# Patient Record
Sex: Male | Born: 1980 | Race: Black or African American | Hispanic: No | Marital: Married | Smoking: Never smoker
Health system: Southern US, Community
[De-identification: ages and names within clinical notes are randomized; demographics above are authoritative.]

## PROBLEM LIST (undated history)

## (undated) DIAGNOSIS — K219 Gastro-esophageal reflux disease without esophagitis: Secondary | ICD-10-CM

## (undated) DIAGNOSIS — E7800 Pure hypercholesterolemia, unspecified: Secondary | ICD-10-CM

## (undated) HISTORY — PX: OTHER SURGICAL HISTORY: SHX169

---

## 2003-07-24 ENCOUNTER — Encounter: Admission: RE | Admit: 2003-07-24 | Discharge: 2003-07-24 | Payer: Self-pay | Admitting: Cardiology

## 2005-02-05 ENCOUNTER — Emergency Department (HOSPITAL_COMMUNITY): Admission: EM | Admit: 2005-02-05 | Discharge: 2005-02-05 | Payer: Self-pay | Admitting: Family Medicine

## 2006-08-24 ENCOUNTER — Emergency Department (HOSPITAL_COMMUNITY): Admission: EM | Admit: 2006-08-24 | Discharge: 2006-08-24 | Payer: Self-pay | Admitting: Emergency Medicine

## 2007-04-05 ENCOUNTER — Emergency Department (HOSPITAL_COMMUNITY): Admission: EM | Admit: 2007-04-05 | Discharge: 2007-04-05 | Payer: Self-pay | Admitting: Emergency Medicine

## 2007-04-10 ENCOUNTER — Encounter: Admission: RE | Admit: 2007-04-10 | Discharge: 2007-04-10 | Payer: Self-pay | Admitting: Sports Medicine

## 2008-11-07 ENCOUNTER — Ambulatory Visit: Payer: Self-pay | Admitting: Interventional Radiology

## 2008-11-07 ENCOUNTER — Emergency Department (HOSPITAL_BASED_OUTPATIENT_CLINIC_OR_DEPARTMENT_OTHER): Admission: EM | Admit: 2008-11-07 | Discharge: 2008-11-07 | Payer: Self-pay | Admitting: Emergency Medicine

## 2010-03-13 ENCOUNTER — Emergency Department (HOSPITAL_BASED_OUTPATIENT_CLINIC_OR_DEPARTMENT_OTHER): Admission: EM | Admit: 2010-03-13 | Discharge: 2010-03-13 | Payer: Self-pay | Admitting: Emergency Medicine

## 2010-04-04 ENCOUNTER — Encounter: Admission: RE | Admit: 2010-04-04 | Discharge: 2010-04-04 | Payer: Self-pay | Admitting: Orthopedic Surgery

## 2010-09-12 LAB — BASIC METABOLIC PANEL
BUN: 16 mg/dL (ref 6–23)
CO2: 24 mEq/L (ref 19–32)
Calcium: 9.4 mg/dL (ref 8.4–10.5)
Chloride: 101 mEq/L (ref 96–112)
Creatinine, Ser: 0.8 mg/dL (ref 0.4–1.5)
GFR calc Af Amer: >60 mL/min (ref >60)
GFR calc non Af Amer: >60 mL/min (ref >60)
Glucose, Bld: 255 mg/dL — ABNORMAL HIGH (ref 70–99)
Potassium: 4 mEq/L (ref 3.5–5.1)
Sodium: 137 mEq/L (ref 135–145)

## 2010-09-12 LAB — CBC
HCT: 43.9 % (ref 39.0–52.0)
Hemoglobin: 15 g/dL (ref 13.0–17.0)
MCH: 29.7 pg (ref 26.0–34.0)
MCHC: 34.1 g/dL (ref 30.0–36.0)
MCV: 87.1 fL (ref 78.0–100.0)
Platelets: 231 10*3/uL (ref 150–400)
RBC: 5.04 MIL/uL (ref 4.22–5.81)
RDW: 12.2 % (ref 11.5–15.5)
WBC: 7.2 10*3/uL (ref 4.0–10.5)

## 2010-10-08 LAB — URINALYSIS, ROUTINE W REFLEX MICROSCOPIC
Bilirubin Urine: NEGATIVE
Glucose, UA: >1000 mg/dL — AB
Hgb urine dipstick: NEGATIVE
Ketones, ur: NEGATIVE mg/dL
Leukocytes, UA: NEGATIVE
Nitrite: NEGATIVE
Protein, ur: NEGATIVE mg/dL
Specific Gravity, Urine: 1.026 (ref 1.005–1.030)
Urobilinogen, UA: 0.2 mg/dL (ref 0.0–1.0)
pH: 6 (ref 5.0–8.0)

## 2010-10-08 LAB — COMPREHENSIVE METABOLIC PANEL
ALT: 22 U/L (ref 0–53)
AST: 23 U/L (ref 0–37)
Albumin: 4.2 g/dL (ref 3.5–5.2)
Alkaline Phosphatase: 53 U/L (ref 39–117)
BUN: 11 mg/dL (ref 6–23)
CO2: 28 mEq/L (ref 19–32)
Calcium: 8.9 mg/dL (ref 8.4–10.5)
Chloride: 100 mEq/L (ref 96–112)
Creatinine, Ser: 0.8 mg/dL (ref 0.4–1.5)
GFR calc Af Amer: >60 mL/min (ref >60)
GFR calc non Af Amer: >60 mL/min (ref >60)
Glucose, Bld: 268 mg/dL — ABNORMAL HIGH (ref 70–99)
Potassium: 4.1 mEq/L (ref 3.5–5.1)
Sodium: 139 mEq/L (ref 135–145)
Total Bilirubin: 0.7 mg/dL (ref 0.3–1.2)
Total Protein: 8 g/dL (ref 6.0–8.3)

## 2010-10-08 LAB — CBC
HCT: 46.4 % (ref 39.0–52.0)
Hemoglobin: 15 g/dL (ref 13.0–17.0)
MCHC: 32.4 g/dL (ref 30.0–36.0)
MCV: 88.4 fL (ref 78.0–100.0)
Platelets: 251 10*3/uL (ref 150–400)
RBC: 5.25 MIL/uL (ref 4.22–5.81)
RDW: 12.5 % (ref 11.5–15.5)
WBC: 5.2 10*3/uL (ref 4.0–10.5)

## 2010-10-08 LAB — DIFFERENTIAL
Basophils Absolute: 0 10*3/uL (ref 0.0–0.1)
Basophils Relative: 1 % (ref 0–1)
Eosinophils Absolute: 0.1 10*3/uL (ref 0.0–0.7)
Eosinophils Relative: 3 % (ref 0–5)
Lymphocytes Relative: 38 % (ref 12–46)
Lymphs Abs: 2 10*3/uL (ref 0.7–4.0)
Monocytes Absolute: 0.4 10*3/uL (ref 0.1–1.0)
Monocytes Relative: 7 % (ref 3–12)
Neutro Abs: 2.7 10*3/uL (ref 1.7–7.7)
Neutrophils Relative %: 52 % (ref 43–77)

## 2010-10-08 LAB — LIPASE, BLOOD: Lipase: 76 U/L (ref 23–300)

## 2010-10-08 LAB — URINE MICROSCOPIC-ADD ON

## 2011-02-11 ENCOUNTER — Emergency Department (HOSPITAL_BASED_OUTPATIENT_CLINIC_OR_DEPARTMENT_OTHER)
Admission: EM | Admit: 2011-02-11 | Discharge: 2011-02-11 | Disposition: A | Discharge status: Home / Self Care | Payer: BC Managed Care – PPO | Attending: Emergency Medicine | Admitting: Emergency Medicine

## 2011-02-11 ENCOUNTER — Encounter: Payer: Self-pay | Admitting: *Deleted

## 2011-02-11 ENCOUNTER — Emergency Department (INDEPENDENT_AMBULATORY_CARE_PROVIDER_SITE_OTHER): Payer: BC Managed Care – PPO

## 2011-02-11 DIAGNOSIS — E119 Type 2 diabetes mellitus without complications: Secondary | ICD-10-CM | POA: Insufficient documentation

## 2011-02-11 DIAGNOSIS — E7800 Pure hypercholesterolemia, unspecified: Secondary | ICD-10-CM | POA: Insufficient documentation

## 2011-02-11 DIAGNOSIS — S99929A Unspecified injury of unspecified foot, initial encounter: Secondary | ICD-10-CM

## 2011-02-11 DIAGNOSIS — K219 Gastro-esophageal reflux disease without esophagitis: Secondary | ICD-10-CM | POA: Insufficient documentation

## 2011-02-11 DIAGNOSIS — S91109A Unspecified open wound of unspecified toe(s) without damage to nail, initial encounter: Secondary | ICD-10-CM | POA: Insufficient documentation

## 2011-02-11 DIAGNOSIS — W2209XA Striking against other stationary object, initial encounter: Secondary | ICD-10-CM | POA: Insufficient documentation

## 2011-02-11 DIAGNOSIS — S8990XA Unspecified injury of unspecified lower leg, initial encounter: Secondary | ICD-10-CM | POA: Insufficient documentation

## 2011-02-11 DIAGNOSIS — IMO0002 Reserved for concepts with insufficient information to code with codable children: Secondary | ICD-10-CM

## 2011-02-11 DIAGNOSIS — I10 Essential (primary) hypertension: Secondary | ICD-10-CM | POA: Insufficient documentation

## 2011-02-11 DIAGNOSIS — M7989 Other specified soft tissue disorders: Secondary | ICD-10-CM

## 2011-02-11 DIAGNOSIS — M79609 Pain in unspecified limb: Secondary | ICD-10-CM

## 2011-02-11 HISTORY — DX: Pure hypercholesterolemia, unspecified: E78.00

## 2011-02-11 HISTORY — DX: Gastro-esophageal reflux disease without esophagitis: K21.9

## 2011-02-11 MED ORDER — CEPHALEXIN 500 MG PO CAPS: 500.0000 mg | ORAL_CAPSULE | Freq: Four times a day (QID) | ORAL | Status: AC

## 2011-02-11 MED ORDER — HYDROCODONE-ACETAMINOPHEN 5-325 MG PO TABS
1.0000 | ORAL_TABLET | Freq: Once | ORAL | Status: AC
  Administered 2011-02-11: 1 via ORAL
  Filled 2011-02-11: qty 1

## 2011-02-11 MED ORDER — HYDROCODONE-ACETAMINOPHEN 5-500 MG PO TABS: 1.0000 | ORAL_TABLET | Freq: Four times a day (QID) | ORAL | Status: AC | PRN

## 2011-02-11 MED ORDER — LIDOCAINE HCL (PF) 1 % IJ SOLN
5.0000 mL | Freq: Once | INTRAMUSCULAR | Status: AC
  Administered 2011-02-11: 5 mL via INTRADERMAL
  Filled 2011-02-11: qty 5

## 2011-02-11 NOTE — ED Notes (Signed)
Pt tripped on a toy tonight approx 2 hours ago, and caught himself before he fell. Pt c/o right 3rd/4th toe pain. Injury to nail bed of 3rd toe and swelling to 4th toe.

## 2011-02-11 NOTE — ED Provider Notes (Signed)
History     CSN: [id number] Arrival date & time: 02/11/2011  1:05 AM  Chief Complaint  Patient presents with  . Toe Pain   HPI Comments: 30yoM NIDDM pw toe pain just pta. PT states that he stepped on his daughter's toy and "stubbed my toe on it or something". C/O pain to 3rd and 4th toes and bleeding to 3rd toe. Denies other injury. No head trauma/LOC. Unable to bear weight RLE 2/2 pain. Denies numbness/tingling. States he cannot move his 4th toe 2/2 pain. Last tetanus 2 years ago  Patient is a 30 y.o. male presenting with toe pain.  Toe Pain    Past Medical History  Diagnosis Date  . Diabetes mellitus   . Hypertension   . High cholesterol   . GERD (gastroesophageal reflux disease)     Past Surgical History  Procedure Date  . Arm surgery     No family history on file.  History  Substance Use Topics  . Smoking status: Never Smoker   . Smokeless tobacco: Not on file  . Alcohol Use: Yes     occasionally      Review of Systems  All other systems reviewed and are negative.  except as noted HPI   Physical Exam  BP 155/90  Pulse 74  Temp(Src) 98.2 F (36.8 C) (Oral)  Resp 18  Ht 6\' 1"  (1.854 m)  Wt 305 lb (138.347 kg)  BMI 40.24 kg/m2  SpO2 96%  Physical Exam  Nursing note and vitals reviewed. Constitutional: He is oriented to person, place, and time. He appears well-developed and well-nourished. No distress.       Appears to be in pain  HENT:  Head: Atraumatic.  Mouth/Throat: Oropharynx is clear and moist.  Eyes: Conjunctivae are normal. Pupils are equal, round, and reactive to light.  Neck: Neck supple.  Cardiovascular: Normal rate, regular rhythm, normal heart sounds and intact distal pulses.  Exam reveals no gallop and no friction rub.   No murmur heard. Pulmonary/Chest: Effort normal. No respiratory distress. He has no wheezes. He has no rales.  Abdominal: Soft. Bowel sounds are normal. There is no tenderness. There is no rebound and no guarding.    Musculoskeletal: Normal range of motion. He exhibits no edema and no tenderness.       Rt foot without swelling/erythema/ttp. 4th toe with diffuse swelling and ttp, limited ROM 2/2 pain. Gross sensation intact. 3rd toe with min swelling distally + blood and partial avulsion of nail. +ttp, gross sensation intact  DP and PT intact  Neurological: He is alert and oriented to person, place, and time.  Skin: Skin is warm and dry.  Psychiatric: He has a normal mood and affect.    ED Course  NERVE BLOCK Date/Time: 02/11/2011 4:28 AM Performed by: Forbes Cellar Authorized by: Forbes Cellar Consent: Verbal consent obtained. Risks and benefits: risks, benefits and alternatives were discussed Consent given by: patient Patient understanding: patient states understanding of the procedure being performed Patient consent: the patient's understanding of the procedure matches consent given Procedure consent: procedure consent matches procedure scheduled Imaging studies: imaging studies available Patient identity confirmed: verbally with patient Time out: Immediately prior to procedure a "time out" was called to verify the correct patient, procedure, equipment, support staff and site/side marked as required. Indications: pain relief Body area: lower extremity Nerve: digital Laterality: right Patient sedated: no Patient position: sitting Needle gauge: 25 G Location technique: anatomical landmarks Local anesthetic: lidocaine 1% without epinephrine Anesthetic total: 5 ml  Outcome: pain improved  NAIL REMOVAL Date/Time: 02/11/2011 4:29 AM Performed by: Forbes Cellar Authorized by: Forbes Cellar Consent: Verbal consent obtained. Risks and benefits: risks, benefits and alternatives were discussed Consent given by: patient Patient understanding: patient states understanding of the procedure being performed Patient consent: the patient's understanding of the procedure matches consent  given Procedure consent: procedure consent matches procedure scheduled Patient identity confirmed: verbally with patient Location: right foot Location details: right third toe Anesthesia: digital block Local anesthetic: lidocaine 1% without epinephrine Anesthetic total: 5 ml Patient sedated: no Preparation: skin prepped with alcohol and skin prepped with Betadine Amount removed: partial Wedge excision of skin of nail fold: no Nail matrix removed: none Removed nail replaced and anchored: yes Dressing: dressing applied Patient tolerance: Patient tolerated the procedure well with no immediate complications. Comments: Nail partially removed by mechanism of injury, remains attached at eponychium. Abrasion to nailbed noted 3mm. Dermabond applied and nail replaced.    MDM 30yo diabetic male with mechanical toe injury. Will take of nail to evaluate for laceration of nailbed.  Has close PMD f/u   4:32 AM Abrasion to nailbed as above. Home with pain medication, prophylactic abx given pt's h/o diabetes. Given precautions for return. Needs wound check in 2 days.  Eden Lathe, MD 02/11/11 [phone]

## 2011-02-11 NOTE — ED Notes (Signed)
MD at bedside. 

## 2011-04-05 ENCOUNTER — Emergency Department (HOSPITAL_BASED_OUTPATIENT_CLINIC_OR_DEPARTMENT_OTHER)
Admission: EM | Admit: 2011-04-05 | Discharge: 2011-04-05 | Disposition: A | Discharge status: Home / Self Care | Payer: BC Managed Care – PPO | Attending: Emergency Medicine | Admitting: Emergency Medicine

## 2011-04-05 ENCOUNTER — Encounter (HOSPITAL_BASED_OUTPATIENT_CLINIC_OR_DEPARTMENT_OTHER): Payer: Self-pay | Admitting: Emergency Medicine

## 2011-04-05 ENCOUNTER — Emergency Department (INDEPENDENT_AMBULATORY_CARE_PROVIDER_SITE_OTHER): Payer: BC Managed Care – PPO

## 2011-04-05 DIAGNOSIS — R112 Nausea with vomiting, unspecified: Secondary | ICD-10-CM

## 2011-04-05 DIAGNOSIS — N390 Urinary tract infection, site not specified: Secondary | ICD-10-CM

## 2011-04-05 DIAGNOSIS — Z79899 Other long term (current) drug therapy: Secondary | ICD-10-CM | POA: Insufficient documentation

## 2011-04-05 DIAGNOSIS — E1169 Type 2 diabetes mellitus with other specified complication: Secondary | ICD-10-CM | POA: Insufficient documentation

## 2011-04-05 DIAGNOSIS — R109 Unspecified abdominal pain: Secondary | ICD-10-CM

## 2011-04-05 DIAGNOSIS — E7800 Pure hypercholesterolemia, unspecified: Secondary | ICD-10-CM | POA: Insufficient documentation

## 2011-04-05 DIAGNOSIS — E119 Type 2 diabetes mellitus without complications: Secondary | ICD-10-CM

## 2011-04-05 DIAGNOSIS — I10 Essential (primary) hypertension: Secondary | ICD-10-CM

## 2011-04-05 DIAGNOSIS — K219 Gastro-esophageal reflux disease without esophagitis: Secondary | ICD-10-CM | POA: Insufficient documentation

## 2011-04-05 DIAGNOSIS — R739 Hyperglycemia, unspecified: Secondary | ICD-10-CM

## 2011-04-05 LAB — URINALYSIS, ROUTINE W REFLEX MICROSCOPIC
Glucose, UA: >1000 mg/dL — AB
Leukocytes, UA: NEGATIVE
Specific Gravity, Urine: 1.036 — ABNORMAL HIGH (ref 1.005–1.030)
pH: 5.5 (ref 5.0–8.0)

## 2011-04-05 LAB — COMPREHENSIVE METABOLIC PANEL
ALT: 31 U/L (ref 0–53)
Albumin: 4.2 g/dL (ref 3.5–5.2)
Alkaline Phosphatase: 49 U/L (ref 39–117)
BUN: 9 mg/dL (ref 6–23)
Chloride: 95 mEq/L — ABNORMAL LOW (ref 96–112)
Glucose, Bld: 337 mg/dL — ABNORMAL HIGH (ref 70–99)
Potassium: 4.3 mEq/L (ref 3.5–5.1)
Total Bilirubin: 0.6 mg/dL (ref 0.3–1.2)

## 2011-04-05 LAB — CBC
HCT: 44.6 % (ref 39.0–52.0)
Hemoglobin: 15.8 g/dL (ref 13.0–17.0)
RDW: 12.6 % (ref 11.5–15.5)
WBC: 14.7 10*3/uL — ABNORMAL HIGH (ref 4.0–10.5)

## 2011-04-05 LAB — URINE MICROSCOPIC-ADD ON

## 2011-04-05 MED ORDER — CIPROFLOXACIN HCL 500 MG PO TABS
500.0000 mg | ORAL_TABLET | Freq: Once | ORAL | Status: AC
  Administered 2011-04-05: 500 mg via ORAL
  Filled 2011-04-05: qty 1

## 2011-04-05 MED ORDER — ONDANSETRON HCL 4 MG/2ML IJ SOLN
4.0000 mg | Freq: Once | INTRAMUSCULAR | Status: AC
  Administered 2011-04-05: 4 mg via INTRAVENOUS
  Filled 2011-04-05: qty 2

## 2011-04-05 MED ORDER — HYDROCODONE-ACETAMINOPHEN 5-325 MG PO TABS: 1.0000 | ORAL_TABLET | ORAL | Status: AC | PRN

## 2011-04-05 MED ORDER — HYDROMORPHONE HCL 1 MG/ML IJ SOLN
1.0000 mg | Freq: Once | INTRAMUSCULAR | Status: AC
  Administered 2011-04-05: 1 mg via INTRAVENOUS
  Filled 2011-04-05: qty 1

## 2011-04-05 MED ORDER — ONDANSETRON HCL 4 MG PO TABS: 8.0000 mg | ORAL_TABLET | Freq: Three times a day (TID) | ORAL | Status: AC | PRN

## 2011-04-05 MED ORDER — CIPROFLOXACIN HCL 500 MG PO TABS: 500.0000 mg | ORAL_TABLET | Freq: Two times a day (BID) | ORAL | Status: AC

## 2011-04-05 MED ORDER — MORPHINE SULFATE 4 MG/ML IJ SOLN
6.0000 mg | Freq: Once | INTRAMUSCULAR | Status: AC
  Administered 2011-04-05: 6 mg via INTRAVENOUS
  Filled 2011-04-05: qty 2

## 2011-04-05 MED ORDER — IOHEXOL 300 MG/ML  SOLN
100.0000 mL | Freq: Once | INTRAMUSCULAR | Status: AC | PRN
  Administered 2011-04-05: 100 mL via INTRAVENOUS

## 2011-04-05 MED ORDER — SODIUM CHLORIDE 0.9 % IV BOLUS (SEPSIS)
1000.0000 mL | Freq: Once | INTRAVENOUS | Status: AC
  Administered 2011-04-05: 1000 mL via INTRAVENOUS

## 2011-04-05 MED ORDER — SODIUM CHLORIDE 0.9 % IV SOLN: Freq: Once | INTRAVENOUS | Status: DC

## 2011-04-05 NOTE — ED Notes (Signed)
MD at bedside.EDP Campos at bed side. 

## 2011-04-05 NOTE — ED Provider Notes (Signed)
History     CSN: [id number] Arrival date & time: 04/05/2011 12:34 PM  Chief Complaint  Patient presents with  . Abdominal Pain     Patient is a 30 y.o. male presenting with abdominal pain. The history is provided by the patient.  Abdominal Pain The primary symptoms of the illness include abdominal pain.   patient developed umbilical and lower abdominal pain last night.  He has had nausea and vomiting has had no diarrhea.  He denies fevers or chills.  His pain is constant in nature it is moderate in severity.  It is worsened by movement and palpation.  Is not relieved by anything.  The pain is described as a soreness and fullness.  Patient denies dysuria testicular pain or penile discharge.  He denies flank pain.  Exam of similar pain in the past.  He has not tried any medicines at home.  He does have a history of diabetes.  He reports compliance with his medications  Past Medical History  Diagnosis Date  . Diabetes mellitus   . Hypertension   . High cholesterol   . GERD (gastroesophageal reflux disease)     Past Surgical History  Procedure Date  . Arm surgery     History reviewed. No pertinent family history.  History  Substance Use Topics  . Smoking status: Never Smoker   . Smokeless tobacco: Not on file  . Alcohol Use: Yes     occasionally      Review of Systems  Gastrointestinal: Positive for abdominal pain.  All other systems reviewed and are negative.    Allergies  Review of patient's allergies indicates no known allergies.  Home Medications   Current Outpatient Rx  Name Route Sig Dispense Refill  . GLIPIZIDE 10 MG PO TABS Oral Take 10 mg by mouth 2 (two) times daily.     Marland Kitchen LISINOPRIL 10 MG PO TABS Oral Take 10 mg by mouth daily.      Marland Kitchen PRAVASTATIN SODIUM 10 MG PO TABS Oral Take 10 mg by mouth daily.      Marland Kitchen SITAGLIPTIN-METFORMIN HCL 50-1000 MG PO TABS Oral Take 1 tablet by mouth 2 (two) times daily with a meal.      . ESOMEPRAZOLE MAGNESIUM 20 MG PO CPDR  Oral Take 20 mg by mouth daily before breakfast.        BP 147/76  Pulse 98  Temp 98.3 F (36.8 C)  Resp 20  Ht 6\' 1"  (1.854 m)  Wt 310 lb (140.615 kg)  BMI 40.90 kg/m2  SpO2 95%  Physical Exam  Nursing note and vitals reviewed. Constitutional: He is oriented to person, place, and time. He appears well-developed and well-nourished.  HENT:  Head: Normocephalic and atraumatic.  Eyes: EOM are normal.  Neck: Normal range of motion.  Cardiovascular: Normal rate, regular rhythm, normal heart sounds and intact distal pulses.   Pulmonary/Chest: Effort normal and breath sounds normal. No respiratory distress.  Abdominal: Soft. He exhibits no distension.       Mild lower abdominal tenderness without guarding or rebound.  Musculoskeletal: Normal range of motion.  Neurological: He is alert and oriented to person, place, and time.  Skin: Skin is warm and dry.  Psychiatric: He has a normal mood and affect. Judgment normal.    ED Course  Procedures (including critical care time)  Labs Reviewed  URINALYSIS, ROUTINE W REFLEX MICROSCOPIC - Abnormal; Notable for the following:    Specific Gravity, Urine 1.036 (*)    Glucose, UA >  1000 (*)    Ketones, ur 15 (*)    All other components within normal limits  CBC - Abnormal; Notable for the following:    WBC 14.7 (*)    All other components within normal limits  COMPREHENSIVE METABOLIC PANEL - Abnormal; Notable for the following:    Sodium 134 (*)    Chloride 95 (*)    Glucose, Bld 337 (*)    All other components within normal limits  URINE MICROSCOPIC-ADD ON - Abnormal; Notable for the following:    Bacteria, UA FEW (*)    All other components within normal limits  URINE CULTURE   Ct Abdomen Pelvis W Contrast  04/05/2011  *RADIOLOGY REPORT*  Clinical Data: Lower abdominal pain and nausea, diabetes, hypertension  CT ABDOMEN AND PELVIS WITH CONTRAST  Technique:  Multidetector CT imaging of the abdomen and pelvis was performed following  the standard protocol during bolus administration of intravenous contrast.  Contrast: OMNIPAQUE IOHEXOL 300 MG/ML IV SOLN  Comparison: 11/07/2008  Findings: Minimal lower lobe atelectasis.  No lower lobe pneumonia. Normal heart size.  No pericardial or pleural effusion.  Negative for hiatal hernia.  Abdomen:  Diffuse hepatic hypoattenuation consistent with fatty infiltration (hepatic steatosis).  No biliary dilatation or focal hepatic abnormality.  Hepatic and portal veins are patent. Gallbladder, biliary system, pancreas, spleen, adrenal glands, and kidneys demonstrate no acute process and are within normal limits for age.  Small incidental sub centimeter left renal cyst in the lower pole, image 48.  No abdominal free fluid, fluid collection, hemorrhage, abscess or adenopathy.  Negative for bowel obstruction, dilatation, ileus or free air.  Pelvis:  Normal appendix identified.  No acute distal bowel process.  No pelvic free fluid, fluid collection, hemorrhage, hematoma, abscess or inguinal abnormality.  No hernia.  Urinary bladder unremarkable.  No acute abnormal osseous finding.  IMPRESSION: No acute intra-abdominal or pelvic process.  Normal appendix.  Original Report Authenticated By: Judie Petit. Ruel Favors, M.D.     1. Abdominal pain   2. Urinary tract infection   3. Hyperglycemia       MDM  CT and labs were obtained.  Initial clinical concern is for appendicitis however he has a normal-appearing appendix on CT scan.  There is some bacteria in his urine and he does have suprapubic lower abdominal pain.  Urine cultures been sent.  Patient be started on antibiotics for suspected early urinary tract infection despite absence of penile discharge.  Will DC home with prescription for antibiotics pain medicine and antiemetics.  Patient's been instructed to followup with his doctor on Monday for any new or worsening symptoms.        Lyanne Co, MD 04/05/11 [id number]

## 2011-04-05 NOTE — ED Notes (Signed)
Abdominal pain since last night , umbilical and both lower quads.  Some radiation to back.  No elimination problems.  No recent injuries, fever or dysuria.

## 2011-04-07 LAB — URINE CULTURE: Colony Count: 4000

## 2011-04-10 LAB — POCT CARDIAC MARKERS
Myoglobin, poc: 79.9
Operator id: 198171

## 2011-09-18 ENCOUNTER — Emergency Department (HOSPITAL_BASED_OUTPATIENT_CLINIC_OR_DEPARTMENT_OTHER)
Admission: EM | Admit: 2011-09-18 | Discharge: 2011-09-18 | Disposition: A | Discharge status: Home / Self Care | Payer: BC Managed Care – PPO | Attending: Emergency Medicine | Admitting: Emergency Medicine

## 2011-09-18 ENCOUNTER — Emergency Department (INDEPENDENT_AMBULATORY_CARE_PROVIDER_SITE_OTHER): Payer: BC Managed Care – PPO

## 2011-09-18 ENCOUNTER — Encounter (HOSPITAL_BASED_OUTPATIENT_CLINIC_OR_DEPARTMENT_OTHER): Payer: Self-pay | Admitting: *Deleted

## 2011-09-18 DIAGNOSIS — I10 Essential (primary) hypertension: Secondary | ICD-10-CM | POA: Insufficient documentation

## 2011-09-18 DIAGNOSIS — R42 Dizziness and giddiness: Secondary | ICD-10-CM

## 2011-09-18 DIAGNOSIS — R51 Headache: Secondary | ICD-10-CM

## 2011-09-18 DIAGNOSIS — E7800 Pure hypercholesterolemia, unspecified: Secondary | ICD-10-CM | POA: Insufficient documentation

## 2011-09-18 DIAGNOSIS — E1169 Type 2 diabetes mellitus with other specified complication: Secondary | ICD-10-CM | POA: Insufficient documentation

## 2011-09-18 DIAGNOSIS — K219 Gastro-esophageal reflux disease without esophagitis: Secondary | ICD-10-CM | POA: Insufficient documentation

## 2011-09-18 DIAGNOSIS — R739 Hyperglycemia, unspecified: Secondary | ICD-10-CM

## 2011-09-18 DIAGNOSIS — Z79899 Other long term (current) drug therapy: Secondary | ICD-10-CM | POA: Insufficient documentation

## 2011-09-18 LAB — URINALYSIS, ROUTINE W REFLEX MICROSCOPIC
Bilirubin Urine: NEGATIVE
Glucose, UA: >1000 mg/dL — AB
Hgb urine dipstick: NEGATIVE
Ketones, ur: 15 mg/dL — AB
Nitrite: NEGATIVE
pH: 5 (ref 5.0–8.0)

## 2011-09-18 LAB — BASIC METABOLIC PANEL
CO2: 25 mEq/L (ref 19–32)
Chloride: 97 mEq/L (ref 96–112)
Glucose, Bld: 316 mg/dL — ABNORMAL HIGH (ref 70–99)
Potassium: 4.2 mEq/L (ref 3.5–5.1)
Sodium: 134 mEq/L — ABNORMAL LOW (ref 135–145)

## 2011-09-18 LAB — GLUCOSE, CAPILLARY
Glucose-Capillary: 344 mg/dL — ABNORMAL HIGH (ref 70–99)
Glucose-Capillary: 261 mg/dL — ABNORMAL HIGH (ref 70–99)

## 2011-09-18 MED ORDER — HYDROMORPHONE HCL PF 1 MG/ML IJ SOLN
1.0000 mg | Freq: Once | INTRAMUSCULAR | Status: AC
  Administered 2011-09-18: 1 mg via INTRAVENOUS
  Filled 2011-09-18: qty 1

## 2011-09-18 MED ORDER — SODIUM CHLORIDE 0.9 % IV BOLUS (SEPSIS)
2000.0000 mL | Freq: Once | INTRAVENOUS | Status: AC
  Administered 2011-09-18: 2000 mL via INTRAVENOUS

## 2011-09-18 NOTE — ED Notes (Signed)
Dr. Jacubowitz at bedside 

## 2011-09-18 NOTE — Discharge Instructions (Signed)

## 2011-09-18 NOTE — ED Notes (Signed)
accucheck 261

## 2011-09-18 NOTE — ED Notes (Signed)
Pt reports feeling dizzy, headache- increased thirst and urination- pt was eval by EMS today but declined transport- alert and speaking full sentences

## 2011-09-18 NOTE — ED Provider Notes (Signed)
History     CSN: [id number]  Arrival date & time 09/18/11  1900   First MD Initiated Contact with Patient 09/18/11 2036      Chief Complaint  Patient presents with  . Headache  . Dizziness    (Consider location/radiation/quality/duration/timing/severity/associated sxs/prior treatment) HPI Complains of pain behind left eye for 2 days gradual onset accompanied by increased thirst and frequent urination. He admits to noncompliance with his medications. No treatment prior to coming here. Admits to lightheadedness worse with standing. Nothing makes symptoms better. No other associated symptoms. Headache is nonradiating sharp in quality no visual change no fever no trauma Past Medical History  Diagnosis Date  . Diabetes mellitus   . Hypertension   . High cholesterol   . GERD (gastroesophageal reflux disease)     Past Surgical History  Procedure Date  . Arm surgery     No family history on file.  History  Substance Use Topics  . Smoking status: Never Smoker   . Smokeless tobacco: Never Used  . Alcohol Use: Yes     occasionally      Review of Systems  Constitutional: Positive for fatigue.  Respiratory: Negative.   Cardiovascular: Negative.   Gastrointestinal: Negative.        Polydipsia  Genitourinary: Positive for frequency.  Musculoskeletal: Negative.   Skin: Negative.   Neurological: Positive for light-headedness and headaches.  Hematological: Negative.   Psychiatric/Behavioral: Negative.   All other systems reviewed and are negative.    Allergies  Review of patient's allergies indicates no known allergies.  Home Medications   Current Outpatient Rx  Name Route Sig Dispense Refill  . GLIPIZIDE 10 MG PO TABS Oral Take 10 mg by mouth 2 (two) times daily.     . INSULIN GLARGINE 100 UNIT/ML [place] SOLN Subcutaneous Inject 30-40 Units into the skin daily.    Marland Kitchen ESOMEPRAZOLE MAGNESIUM 20 MG PO CPDR Oral Take 20 mg by mouth daily before breakfast.      . LISINOPRIL  10 MG PO TABS Oral Take 10 mg by mouth daily.      Marland Kitchen PRAVASTATIN SODIUM 10 MG PO TABS Oral Take 10 mg by mouth daily.      Marland Kitchen SITAGLIPTIN-METFORMIN HCL 50-1000 MG PO TABS Oral Take 1 tablet by mouth 2 (two) times daily with a meal.        BP 160/93  Pulse 75  Resp 18  SpO2 96%  Physical Exam  Constitutional: He appears well-developed and well-nourished. No distress.  HENT:  Head: Normocephalic and atraumatic.       Mucous membranes dry  Eyes: Conjunctivae are normal. Pupils are equal, round, and reactive to light.       Fundi benign  Neck: Neck supple. No tracheal deviation present. No thyromegaly present.  Cardiovascular: Normal rate and regular rhythm.   No murmur heard. Pulmonary/Chest: Effort normal and breath sounds normal.  Abdominal: Soft. Bowel sounds are normal. He exhibits no distension. There is no tenderness.  Musculoskeletal: Normal range of motion. He exhibits no edema and no tenderness.  Neurological: He is alert. He has normal reflexes. He displays normal reflexes. Coordination normal.       Gait normal; becomes mildly lightheaded on standing  Skin: Skin is warm and dry. No rash noted.  Psychiatric: He has a normal mood and affect. Judgment and thought content normal.    ED Course  Procedures (including critical care time)  Labs Reviewed  GLUCOSE, CAPILLARY - Abnormal; Notable for the following:  Glucose-Capillary 344 (*)    All other components within normal limits  URINALYSIS, ROUTINE W REFLEX MICROSCOPIC - Abnormal; Notable for the following:    Specific Gravity, Urine 1.040 (*)    Glucose, UA >1000 (*)    Ketones, ur 15 (*)    All other components within normal limits  URINE MICROSCOPIC-ADD ON   No results found.  Results for orders placed during the hospital encounter of 09/18/11  GLUCOSE, CAPILLARY      Component Value Range   Glucose-Capillary 344 (*) 70 - 99 (mg/dL)  URINALYSIS, ROUTINE W REFLEX MICROSCOPIC      Component Value Range    Color, Urine YELLOW  YELLOW    APPearance CLEAR  CLEAR    Specific Gravity, Urine 1.040 (*) 1.005 - 1.030    pH 5.0  5.0 - 8.0    Glucose, UA >1000 (*) NEGATIVE (mg/dL)   Hgb urine dipstick NEGATIVE  NEGATIVE    Bilirubin Urine NEGATIVE  NEGATIVE    Ketones, ur 15 (*) NEGATIVE (mg/dL)   Protein, ur NEGATIVE  NEGATIVE (mg/dL)   Urobilinogen, UA 0.2  0.0 - 1.0 (mg/dL)   Nitrite NEGATIVE  NEGATIVE    Leukocytes, UA NEGATIVE  NEGATIVE   URINE MICROSCOPIC-ADD ON      Component Value Range   Squamous Epithelial / LPF RARE  RARE    Bacteria, UA RARE  RARE   BASIC METABOLIC PANEL      Component Value Range   Sodium 134 (*) 135 - 145 (mEq/L)   Potassium 4.2  3.5 - 5.1 (mEq/L)   Chloride 97  96 - 112 (mEq/L)   CO2 25  19 - 32 (mEq/L)   Glucose, Bld 316 (*) 70 - 99 (mg/dL)   BUN 12  6 - 23 (mg/dL)   Creatinine, Ser 1.47  0.50 - 1.35 (mg/dL)   Calcium 9.5  8.4 - 82.9 (mg/dL)   GFR calc non Af Amer >90  >90 (mL/min)   GFR calc Af Amer >90  >90 (mL/min)  GLUCOSE, CAPILLARY      Component Value Range   Glucose-Capillary 261 (*) 70 - 99 (mg/dL)   Ct Head Wo Contrast  09/18/2011  *RADIOLOGY REPORT*  Clinical Data: Headache and dizziness.  CT HEAD WITHOUT CONTRAST  Technique:  Contiguous axial images were obtained from the base of the skull through the vertex without contrast.  Comparison: None.  Findings: No acute intracranial abnormality is present. Specifically, there is no evidence for acute infarct, hemorrhage, mass, hydrocephalus, or extra-axial fluid collection.  The paranasal sinuses and mastoid air cells are clear.  The globes and orbits are intact.  The osseous skull is intact.  IMPRESSION: Negative CT of the head.  Original Report Authenticated By: Jamesetta Orleans. MATTERN, M.D.    No diagnosis found.  10:57 PM patient feels improved he is awake alert and Glasgow Coma Score 15 after treatment with intravenous Dilaudid and intravenous fluids  MDM  Assessment transiently elevated blood  pressure may be due to secondary to pain I stressed the patient needs to be compliant with his medications he has not been taking glipizide or his antihypertensives He has an appointment scheduled for with his endocrinologist next week Diagnosis #1 nonspecific headache #2 hyperglycemia #3 hypertension #4 dehydration #5 medication noncompliance        Doug Sou, MD 09/19/11 [phone]

## 2011-09-18 NOTE — ED Notes (Signed)
Pt also reports increased thirst, urination for the past week. States he does not check his BS regularly, usually only 1-2 times weekly.

## 2011-12-25 ENCOUNTER — Encounter (HOSPITAL_BASED_OUTPATIENT_CLINIC_OR_DEPARTMENT_OTHER): Payer: Self-pay | Admitting: Emergency Medicine

## 2011-12-25 ENCOUNTER — Emergency Department (HOSPITAL_BASED_OUTPATIENT_CLINIC_OR_DEPARTMENT_OTHER)
Admission: EM | Admit: 2011-12-25 | Discharge: 2011-12-25 | Disposition: A | Discharge status: Home / Self Care | Payer: BC Managed Care – PPO | Attending: Emergency Medicine | Admitting: Emergency Medicine

## 2011-12-25 ENCOUNTER — Emergency Department (HOSPITAL_BASED_OUTPATIENT_CLINIC_OR_DEPARTMENT_OTHER): Payer: BC Managed Care – PPO

## 2011-12-25 DIAGNOSIS — R110 Nausea: Secondary | ICD-10-CM | POA: Insufficient documentation

## 2011-12-25 DIAGNOSIS — R1013 Epigastric pain: Secondary | ICD-10-CM | POA: Insufficient documentation

## 2011-12-25 DIAGNOSIS — E119 Type 2 diabetes mellitus without complications: Secondary | ICD-10-CM | POA: Insufficient documentation

## 2011-12-25 DIAGNOSIS — Z794 Long term (current) use of insulin: Secondary | ICD-10-CM | POA: Insufficient documentation

## 2011-12-25 DIAGNOSIS — R12 Heartburn: Secondary | ICD-10-CM | POA: Insufficient documentation

## 2011-12-25 DIAGNOSIS — K219 Gastro-esophageal reflux disease without esophagitis: Secondary | ICD-10-CM | POA: Insufficient documentation

## 2011-12-25 DIAGNOSIS — R9431 Abnormal electrocardiogram [ECG] [EKG]: Secondary | ICD-10-CM

## 2011-12-25 DIAGNOSIS — I10 Essential (primary) hypertension: Secondary | ICD-10-CM | POA: Insufficient documentation

## 2011-12-25 DIAGNOSIS — Z79899 Other long term (current) drug therapy: Secondary | ICD-10-CM | POA: Insufficient documentation

## 2011-12-25 DIAGNOSIS — E7800 Pure hypercholesterolemia, unspecified: Secondary | ICD-10-CM | POA: Insufficient documentation

## 2011-12-25 LAB — COMPREHENSIVE METABOLIC PANEL
ALT: 27 U/L (ref 0–53)
Alkaline Phosphatase: 45 U/L (ref 39–117)
BUN: 8 mg/dL (ref 6–23)
CO2: 27 mEq/L (ref 19–32)
Chloride: 97 mEq/L (ref 96–112)
GFR calc Af Amer: >90 mL/min (ref >90)
GFR calc non Af Amer: >90 mL/min (ref >90)
Glucose, Bld: 199 mg/dL — ABNORMAL HIGH (ref 70–99)
Potassium: 4.1 mEq/L (ref 3.5–5.1)
Sodium: 135 mEq/L (ref 135–145)
Total Bilirubin: 0.6 mg/dL (ref 0.3–1.2)
Total Protein: 8.8 g/dL — ABNORMAL HIGH (ref 6.0–8.3)

## 2011-12-25 LAB — CBC WITH DIFFERENTIAL/PLATELET
Eosinophils Absolute: 0.1 10*3/uL (ref 0.0–0.7)
Hemoglobin: 16.7 g/dL (ref 13.0–17.0)
Lymphocytes Relative: 21 % (ref 12–46)
Lymphs Abs: 1.7 10*3/uL (ref 0.7–4.0)
MCH: 29.2 pg (ref 26.0–34.0)
MCV: 80.9 fL (ref 78.0–100.0)
Monocytes Relative: 6 % (ref 3–12)
Neutrophils Relative %: 72 % (ref 43–77)
RBC: 5.71 MIL/uL (ref 4.22–5.81)
WBC: 8.1 10*3/uL (ref 4.0–10.5)

## 2011-12-25 LAB — LIPASE, BLOOD: Lipase: 27 U/L (ref 11–59)

## 2011-12-25 LAB — GLUCOSE, CAPILLARY: Glucose-Capillary: 221 mg/dL — ABNORMAL HIGH (ref 70–99)

## 2011-12-25 MED ORDER — OXYCODONE-ACETAMINOPHEN 5-325 MG PO TABS: 2.0000 | ORAL_TABLET | ORAL | Status: AC | PRN

## 2011-12-25 MED ORDER — ONDANSETRON HCL 4 MG/2ML IJ SOLN
4.0000 mg | Freq: Once | INTRAMUSCULAR | Status: AC
  Administered 2011-12-25: 4 mg via INTRAVENOUS
  Filled 2011-12-25: qty 2

## 2011-12-25 MED ORDER — FAMOTIDINE 20 MG PO TABS
40.0000 mg | ORAL_TABLET | Freq: Once | ORAL | Status: AC
  Administered 2011-12-25: 40 mg via ORAL
  Filled 2011-12-25: qty 1

## 2011-12-25 MED ORDER — SODIUM CHLORIDE 0.9 % IV SOLN: Freq: Once | INTRAVENOUS | Status: DC

## 2011-12-25 MED ORDER — PANTOPRAZOLE SODIUM 40 MG IV SOLR
40.0000 mg | Freq: Once | INTRAVENOUS | Status: AC
  Administered 2011-12-25: 40 mg via INTRAVENOUS
  Filled 2011-12-25: qty 40

## 2011-12-25 MED ORDER — GI COCKTAIL ~~LOC~~
30.0000 mL | Freq: Once | ORAL | Status: AC
  Administered 2011-12-25: 30 mL via ORAL
  Filled 2011-12-25: qty 30

## 2011-12-25 MED ORDER — SODIUM CHLORIDE 0.9 % IV BOLUS (SEPSIS)
1000.0000 mL | Freq: Once | INTRAVENOUS | Status: AC
  Administered 2011-12-25: 1000 mL via INTRAVENOUS

## 2011-12-25 MED ORDER — SUCRALFATE 1 G PO TABS: 1.0000 g | ORAL_TABLET | Freq: Four times a day (QID) | ORAL | Status: DC

## 2011-12-25 MED ORDER — SUCRALFATE 1 G PO TABS
1.0000 g | ORAL_TABLET | Freq: Three times a day (TID) | ORAL | Status: DC
  Administered 2011-12-25: 1 g via ORAL
  Filled 2011-12-25: qty 1

## 2011-12-25 NOTE — Discharge Instructions (Signed)
Follow up with the cardiologist about your abnormal ecg. Esophagitis Esophagitis is inflammation of the esophagus. It can involve swelling, soreness, and pain in the esophagus. This condition can make it difficult and painful to swallow. CAUSES  Most causes of esophagitis are not serious. Many different factors can cause esophagitis, including:  Gastroesophageal reflux disease (GERD). This is when acid from your stomach flows up into the esophagus.   Recurrent vomiting.   An allergic-type reaction.   Certain medicines, especially those that come in large pills.   Ingestion of harmful chemicals, such as household cleaning products.   Heavy alcohol use.   An infection of the esophagus.   Radiation treatment for cancer.   Certain diseases such as sarcoidosis, Crohn's disease, and scleroderma. These diseases may cause recurrent esophagitis.  SYMPTOMS   Trouble swallowing.   Painful swallowing.   Chest pain.   Difficulty breathing.   Nausea.   Vomiting.   Abdominal pain.  DIAGNOSIS  Your caregiver will take your history and do a physical exam. Depending upon what your caregiver finds, certain tests may also be done, including:  Barium X-ray. You will drink a solution that coats the esophagus, and X-rays will be taken.   Endoscopy. A lighted tube is put down the esophagus so your caregiver can examine the area.   Allergy tests. These can sometimes be arranged through follow-up visits.  TREATMENT  Treatment will depend on the cause of your esophagitis. In some cases, steroids or other medicines may be given to help relieve your symptoms or to treat the underlying cause of your condition. Medicines that may be recommended include:  Viscous lidocaine, to soothe the esophagus.   Antacids.   Acid reducers.   Proton pump inhibitors.   Antiviral medicines for certain viral infections of the esophagus.   Antifungal medicines for certain fungal infections of the esophagus.    Antibiotic medicines, depending on the cause of the esophagitis.  HOME CARE INSTRUCTIONS   Avoid foods and drinks that seem to make your symptoms worse.   Eat small, frequent meals instead of large meals.   Avoid eating for the 3 hours prior to your bedtime.   If you have trouble taking pills, use a pill splitter to decrease the size and likelihood of the pill getting stuck or injuring the esophagus on the way down. Drinking water after taking a pill also helps.   Stop smoking if you smoke.   Maintain a healthy weight.   Wear loose-fitting clothing. Do not wear anything tight around your waist that causes pressure on your stomach.   Raise the head of your bed 6 to 8 inches with wood blocks to help you sleep. Extra pillows will not help.   Only take over-the-counter or prescription medicines as directed by your caregiver.  SEEK IMMEDIATE MEDICAL CARE IF:  You have severe chest pain that radiates into your arm, neck, or jaw.   You feel sweaty, dizzy, or lightheaded.   You have shortness of breath.   You vomit blood.   You have difficulty or pain with swallowing.   You have bloody or black, tarry stools.   You have a fever.   You have a burning sensation in the chest more than 3 times a week for more than 2 weeks.   You cannot swallow, drink, or eat.   You drool because you cannot swallow your saliva.  MAKE SURE YOU:  Understand these instructions.   Will watch your condition.   Will get  help right away if you are not doing well or get worse.  Document Released: 07/24/2004 Document Revised: 06/05/2011 Document Reviewed: 02/14/2011 [facility] Patient Information 2012 [place], [place].

## 2011-12-25 NOTE — ED Notes (Signed)
Heartburn, sob, chest burning, nausea since this am.  No fever or diarrhea.  Blood sugars have been running 200-300's.

## 2011-12-25 NOTE — ED Notes (Signed)
Patient transported to X-ray 

## 2011-12-25 NOTE — ED Notes (Signed)
CBG 221. RN Cleatrice Burke aware

## 2011-12-25 NOTE — ED Provider Notes (Signed)
History     CSN: [id number]  Arrival date & time 12/25/11  1413   First MD Initiated Contact with Patient 12/25/11 1515      Chief Complaint  Patient presents with  . Heartburn  . Abdominal Pain  . Nausea    (Consider location/radiation/quality/duration/timing/severity/associated sxs/prior treatment) Patient is a 31 y.o. male presenting with heartburn and abdominal pain. The history is provided by the patient.  Heartburn Associated symptoms include abdominal pain.  Abdominal Pain The primary symptoms of the illness include abdominal pain.  Additional symptoms associated with the illness include heartburn.   patient here with abdominal pain located in his epigastric area described as burning which began this morning when he awoke and leaning forward. Patient notes a bitter taste in his mouth and some associated emesis. No anginal type symptoms. Does admit to having hamburgers last night. Admits to noncompliance with his diabetic diet and has seen his doctor for this recently. No fever or diarrhea. No dyspnea or change in bowel habits. No prior history of similar symptoms. No medications taken prior to arrival and nothing makes symptoms better  Past Medical History  Diagnosis Date  . Diabetes mellitus   . Hypertension   . High cholesterol   . GERD (gastroesophageal reflux disease)     Past Surgical History  Procedure Date  . Arm surgery   . Arm surgery     History reviewed. No pertinent family history.  History  Substance Use Topics  . Smoking status: Never Smoker   . Smokeless tobacco: Never Used  . Alcohol Use: Yes     occasionally      Review of Systems  Gastrointestinal: Positive for heartburn and abdominal pain.  All other systems reviewed and are negative.    Allergies  Review of patient's allergies indicates no known allergies.  Home Medications   Current Outpatient Rx  Name Route Sig Dispense Refill  . GLIPIZIDE 10 MG PO TABS Oral Take 10 mg by  mouth 2 (two) times daily.     . INSULIN GLARGINE 100 UNIT/ML [place] SOLN Subcutaneous Inject 60 Units into the skin daily.     Marland Kitchen SITAGLIPTIN-METFORMIN HCL 50-1000 MG PO TABS Oral Take 1 tablet by mouth 2 (two) times daily with a meal.      . ESOMEPRAZOLE MAGNESIUM 20 MG PO CPDR Oral Take 20 mg by mouth daily before breakfast. Patient is using this medication for acid reflux.    Marland Kitchen LISINOPRIL 10 MG PO TABS Oral Take 10 mg by mouth daily.      Marland Kitchen PRAVASTATIN SODIUM 10 MG PO TABS Oral Take 10 mg by mouth daily.        BP 166/108  Pulse 91  Temp 97.8 F (36.6 C) (Oral)  Resp 20  Ht 6\' 2"  (1.88 m)  Wt 310 lb (140.615 kg)  BMI 39.80 kg/m2  SpO2 97%  Physical Exam  Nursing note and vitals reviewed. Constitutional: He is oriented to person, place, and time. He appears well-developed and well-nourished.  Non-toxic appearance. No distress.  HENT:  Head: Normocephalic and atraumatic.  Eyes: Conjunctivae, EOM and lids are normal. Pupils are equal, round, and reactive to light.  Neck: Normal range of motion. Neck supple. No tracheal deviation present. No mass present.  Cardiovascular: Normal rate, regular rhythm and normal heart sounds.  Exam reveals no gallop.   No murmur heard. Pulmonary/Chest: Effort normal and breath sounds normal. No stridor. No respiratory distress. He has no decreased breath sounds. He has no  wheezes. He has no rhonchi. He has no rales.  Abdominal: Soft. Normal appearance and bowel sounds are normal. He exhibits no distension. There is tenderness in the epigastric area. There is no rigidity, no rebound, no guarding and no CVA tenderness.  Musculoskeletal: Normal range of motion. He exhibits no edema and no tenderness.  Neurological: He is alert and oriented to person, place, and time. He has normal strength. No cranial nerve deficit or sensory deficit. GCS eye subscore is 4. GCS verbal subscore is 5. GCS motor subscore is 6.  Skin: Skin is warm and dry. No abrasion and no rash  noted.  Psychiatric: He has a normal mood and affect. His speech is normal and behavior is normal.    ED Course  Procedures (including critical care time)  Labs Reviewed  GLUCOSE, CAPILLARY - Abnormal; Notable for the following:    Glucose-Capillary 221 (*)     All other components within normal limits   No results found.   No diagnosis found.    MDM  The patient given IV fluids and pain medication here gallbladder. Suspect the patient has GERD.I doubt that the patient has ACS. EKG without acute ischemic changes. Patient has responded well to GI cocktail and Pepcid. Patient's hyperglycemia noted he will followup with Dr. for this. Will place patient on Carafate and suspect he has esophagitis  Pt told to f/u his with cards about his t-wave changes  Date: 12/25/2011  Rate: 88  Rhythm: normal sinus rhythm  QRS Axis: normal  Intervals: normal  ST/T Wave abnormalities: nonspecific T wave changes  Conduction Disutrbances:none  Narrative Interpretation:   Old EKG Reviewed: none available          Toy Baker, MD 12/25/11 1814

## 2012-04-09 ENCOUNTER — Emergency Department (HOSPITAL_BASED_OUTPATIENT_CLINIC_OR_DEPARTMENT_OTHER): Payer: Worker's Compensation

## 2012-04-09 ENCOUNTER — Encounter (HOSPITAL_BASED_OUTPATIENT_CLINIC_OR_DEPARTMENT_OTHER): Payer: Self-pay | Admitting: *Deleted

## 2012-04-09 ENCOUNTER — Emergency Department (HOSPITAL_BASED_OUTPATIENT_CLINIC_OR_DEPARTMENT_OTHER)
Admission: EM | Admit: 2012-04-09 | Discharge: 2012-04-09 | Disposition: A | Discharge status: Home / Self Care | Payer: Worker's Compensation | Attending: Emergency Medicine | Admitting: Emergency Medicine

## 2012-04-09 DIAGNOSIS — H538 Other visual disturbances: Secondary | ICD-10-CM | POA: Insufficient documentation

## 2012-04-09 DIAGNOSIS — Z794 Long term (current) use of insulin: Secondary | ICD-10-CM | POA: Insufficient documentation

## 2012-04-09 DIAGNOSIS — Z79899 Other long term (current) drug therapy: Secondary | ICD-10-CM | POA: Insufficient documentation

## 2012-04-09 DIAGNOSIS — E7800 Pure hypercholesterolemia, unspecified: Secondary | ICD-10-CM | POA: Insufficient documentation

## 2012-04-09 DIAGNOSIS — E119 Type 2 diabetes mellitus without complications: Secondary | ICD-10-CM | POA: Insufficient documentation

## 2012-04-09 DIAGNOSIS — R112 Nausea with vomiting, unspecified: Secondary | ICD-10-CM | POA: Insufficient documentation

## 2012-04-09 DIAGNOSIS — H53149 Visual discomfort, unspecified: Secondary | ICD-10-CM | POA: Insufficient documentation

## 2012-04-09 DIAGNOSIS — R51 Headache: Secondary | ICD-10-CM | POA: Insufficient documentation

## 2012-04-09 DIAGNOSIS — IMO0002 Reserved for concepts with insufficient information to code with codable children: Secondary | ICD-10-CM | POA: Insufficient documentation

## 2012-04-09 DIAGNOSIS — K219 Gastro-esophageal reflux disease without esophagitis: Secondary | ICD-10-CM | POA: Insufficient documentation

## 2012-04-09 MED ORDER — OXYCODONE-ACETAMINOPHEN 5-325 MG PO TABS: 2.0000 | ORAL_TABLET | ORAL | Status: DC | PRN

## 2012-04-09 MED ORDER — ACETAMINOPHEN 500 MG PO TABS
1000.0000 mg | ORAL_TABLET | Freq: Once | ORAL | Status: AC
  Administered 2012-04-09: 1000 mg via ORAL
  Filled 2012-04-09: qty 2

## 2012-04-09 MED ORDER — ONDANSETRON 8 MG PO TBDP
8.0000 mg | ORAL_TABLET | Freq: Once | ORAL | Status: AC
  Administered 2012-04-09: 8 mg via ORAL
  Filled 2012-04-09: qty 1

## 2012-04-09 MED ORDER — PROMETHAZINE HCL 25 MG PO TABS: 25.0000 mg | ORAL_TABLET | Freq: Four times a day (QID) | ORAL | Status: DC | PRN

## 2012-04-09 MED ORDER — HYDROMORPHONE HCL 2 MG PO TABS
1.0000 mg | ORAL_TABLET | Freq: Once | ORAL | Status: DC
  Filled 2012-04-09: qty 1
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
## 2012-04-10 NOTE — ED Provider Notes (Signed)
Medical screening examination/treatment/procedure(s) were performed by non-physician practitioner and as supervising physician I was immediately available for consultation/collaboration.   Giliana Vantil B. Lilybelle Mayeda, MD 04/10/12 0843 

## 2013-01-26 ENCOUNTER — Encounter (HOSPITAL_BASED_OUTPATIENT_CLINIC_OR_DEPARTMENT_OTHER): Payer: Self-pay | Admitting: Emergency Medicine

## 2013-01-26 ENCOUNTER — Emergency Department (HOSPITAL_BASED_OUTPATIENT_CLINIC_OR_DEPARTMENT_OTHER): Payer: BC Managed Care – PPO

## 2013-01-26 ENCOUNTER — Emergency Department (HOSPITAL_BASED_OUTPATIENT_CLINIC_OR_DEPARTMENT_OTHER)
Admission: EM | Admit: 2013-01-26 | Discharge: 2013-01-26 | Disposition: A | Discharge status: Home / Self Care | Payer: BC Managed Care – PPO | Attending: Emergency Medicine | Admitting: Emergency Medicine

## 2013-01-26 DIAGNOSIS — Z794 Long term (current) use of insulin: Secondary | ICD-10-CM | POA: Insufficient documentation

## 2013-01-26 DIAGNOSIS — R739 Hyperglycemia, unspecified: Secondary | ICD-10-CM

## 2013-01-26 DIAGNOSIS — R109 Unspecified abdominal pain: Secondary | ICD-10-CM | POA: Insufficient documentation

## 2013-01-26 DIAGNOSIS — Z79899 Other long term (current) drug therapy: Secondary | ICD-10-CM | POA: Insufficient documentation

## 2013-01-26 DIAGNOSIS — K219 Gastro-esophageal reflux disease without esophagitis: Secondary | ICD-10-CM | POA: Insufficient documentation

## 2013-01-26 DIAGNOSIS — R1110 Vomiting, unspecified: Secondary | ICD-10-CM | POA: Insufficient documentation

## 2013-01-26 DIAGNOSIS — E7800 Pure hypercholesterolemia, unspecified: Secondary | ICD-10-CM | POA: Insufficient documentation

## 2013-01-26 DIAGNOSIS — E119 Type 2 diabetes mellitus without complications: Secondary | ICD-10-CM | POA: Insufficient documentation

## 2013-01-26 DIAGNOSIS — E1143 Type 2 diabetes mellitus with diabetic autonomic (poly)neuropathy: Secondary | ICD-10-CM

## 2013-01-26 LAB — COMPREHENSIVE METABOLIC PANEL
ALT: 27 U/L (ref 0–53)
Albumin: 3.6 g/dL (ref 3.5–5.2)
Alkaline Phosphatase: 51 U/L (ref 39–117)
Calcium: 9.7 mg/dL (ref 8.4–10.5)
GFR calc Af Amer: >90 mL/min (ref >90)
Glucose, Bld: 538 mg/dL — ABNORMAL HIGH (ref 70–99)
Potassium: 3.8 mEq/L (ref 3.5–5.1)
Sodium: 131 mEq/L — ABNORMAL LOW (ref 135–145)
Total Protein: 7.3 g/dL (ref 6.0–8.3)

## 2013-01-26 LAB — URINALYSIS, ROUTINE W REFLEX MICROSCOPIC
Bilirubin Urine: NEGATIVE
Glucose, UA: >1000 mg/dL — AB
Ketones, ur: 15 mg/dL — AB
Leukocytes, UA: NEGATIVE
Nitrite: NEGATIVE
Specific Gravity, Urine: 1.037 — ABNORMAL HIGH (ref 1.005–1.030)
pH: 5.5 (ref 5.0–8.0)

## 2013-01-26 LAB — CBC WITH DIFFERENTIAL/PLATELET
Basophils Relative: 0 % (ref 0–1)
Eosinophils Absolute: 0.1 10*3/uL (ref 0.0–0.7)
Eosinophils Relative: 1 % (ref 0–5)
Lymphs Abs: 2.5 10*3/uL (ref 0.7–4.0)
MCH: 29.3 pg (ref 26.0–34.0)
MCHC: 35 g/dL (ref 30.0–36.0)
MCV: 83.8 fL (ref 78.0–100.0)
Neutrophils Relative %: 58 % (ref 43–77)
Platelets: 225 10*3/uL (ref 150–400)
RDW: 13.2 % (ref 11.5–15.5)

## 2013-01-26 LAB — URINE MICROSCOPIC-ADD ON

## 2013-01-26 MED ORDER — HYDROMORPHONE HCL PF 1 MG/ML IJ SOLN
1.0000 mg | Freq: Once | INTRAMUSCULAR | Status: AC
  Administered 2013-01-26: 1 mg via INTRAVENOUS
  Filled 2013-01-26: qty 1

## 2013-01-26 MED ORDER — INSULIN REGULAR HUMAN 100 UNIT/ML IJ SOLN
INTRAMUSCULAR | Status: AC
  Filled 2013-01-26: qty 10

## 2013-01-26 MED ORDER — ONDANSETRON HCL 4 MG/2ML IJ SOLN
4.0000 mg | Freq: Once | INTRAMUSCULAR | Status: AC
  Administered 2013-01-26: 4 mg via INTRAVENOUS
  Filled 2013-01-26: qty 2

## 2013-01-26 MED ORDER — SODIUM CHLORIDE 0.9 % IV SOLN
Freq: Once | INTRAVENOUS | Status: AC
  Administered 2013-01-26: 18:00:00 via INTRAVENOUS

## 2013-01-26 MED ORDER — SODIUM CHLORIDE 0.9 % IV SOLN: INTRAVENOUS | Status: DC

## 2013-01-26 MED ORDER — INSULIN REGULAR HUMAN 100 UNIT/ML IJ SOLN
INTRAMUSCULAR | Status: AC
  Administered 2013-01-26: 10 [IU] via SUBCUTANEOUS
  Filled 2013-01-26: qty 1

## 2013-01-26 MED ORDER — METOCLOPRAMIDE HCL 5 MG/ML IJ SOLN
10.0000 mg | Freq: Once | INTRAMUSCULAR | Status: AC
  Administered 2013-01-26: 10 mg via INTRAVENOUS
  Filled 2013-01-26: qty 2

## 2013-01-26 MED ORDER — HYDROCODONE-ACETAMINOPHEN 5-325 MG PO TABS: 2.0000 | ORAL_TABLET | ORAL | Status: DC | PRN

## 2013-01-26 MED ORDER — INSULIN REGULAR HUMAN 100 UNIT/ML IJ SOLN: 10.0000 [IU] | Freq: Once | INTRAMUSCULAR | Status: AC

## 2013-01-26 MED ORDER — PROMETHAZINE HCL 25 MG PO TABS: 25.0000 mg | ORAL_TABLET | Freq: Four times a day (QID) | ORAL | Status: DC | PRN

## 2013-01-26 MED ORDER — DIPHENHYDRAMINE HCL 50 MG/ML IJ SOLN
25.0000 mg | Freq: Once | INTRAMUSCULAR | Status: AC
  Administered 2013-01-26: 25 mg via INTRAVENOUS
  Filled 2013-01-26: qty 1

## 2013-01-26 MED ORDER — SODIUM CHLORIDE 0.9 % IV BOLUS (SEPSIS)
1000.0000 mL | Freq: Once | INTRAVENOUS | Status: AC
  Administered 2013-01-26: 1000 mL via INTRAVENOUS

## 2013-01-26 MED ORDER — INSULIN REGULAR BOLUS VIA INFUSION
0.0000 [IU] | Freq: Three times a day (TID) | INTRAVENOUS | Status: DC
  Filled 2013-01-26: qty 10

## 2013-01-26 MED ORDER — FAMOTIDINE IN NACL 20-0.9 MG/50ML-% IV SOLN
20.0000 mg | Freq: Once | INTRAVENOUS | Status: AC
  Administered 2013-01-26: 20 mg via INTRAVENOUS
  Filled 2013-01-26: qty 50

## 2013-01-26 MED ORDER — SODIUM CHLORIDE 0.9 % IV SOLN
INTRAVENOUS | Status: DC
  Administered 2013-01-26: 20:00:00 via INTRAVENOUS

## 2013-01-26 NOTE — ED Notes (Signed)
Pt c/o abdominal pain x 2wks. Gets worse when eating fatty foods.

## 2013-01-26 NOTE — ED Notes (Signed)
Pt returned from CT and c/o returning abd pain. PA made aware.

## 2013-01-26 NOTE — ED Notes (Signed)
Patient transported to Ultrasound 

## 2013-01-26 NOTE — ED Provider Notes (Signed)
CSN: [id number]     Arrival date & time 01/26/13  1708 History     First MD Initiated Contact with Patient 01/26/13 1721     Chief Complaint  Patient presents with  . Abdominal Pain   (Consider location/radiation/quality/duration/timing/severity/associated sxs/prior Treatment) Patient is a 32 y.o. male presenting with abdominal pain. The history is provided by the patient. No language interpreter was used.  Abdominal Pain This is a new problem. The current episode started today. The problem occurs constantly. The problem has been gradually worsening. Associated symptoms include abdominal pain and vomiting. Nothing aggravates the symptoms. He has tried nothing for the symptoms. The treatment provided moderate relief.   Pt complains of right upper andmid abdominal pain.  Pt complains of nausea and decreased appetite.  No fever, no diarrhea Past Medical History  Diagnosis Date  . Diabetes mellitus   . High cholesterol   . GERD (gastroesophageal reflux disease)    Past Surgical History  Procedure Laterality Date  . Arm surgery    . Arm surgery     No family history on file. History  Substance Use Topics  . Smoking status: Never Smoker   . Smokeless tobacco: Never Used  . Alcohol Use: No     Comment: occasionally    Review of Systems  Gastrointestinal: Positive for vomiting and abdominal pain.  All other systems reviewed and are negative.    Allergies  Review of patient's allergies indicates no known allergies.  Home Medications   Current Outpatient Rx  Name  Route  Sig  Dispense  Refill  . esomeprazole (NEXIUM) 20 MG capsule   Oral   Take 20 mg by mouth daily before breakfast. Patient is using this medication for acid reflux.         Marland Kitchen glipiZIDE (GLUCOTROL) 10 MG tablet   Oral   Take 10 mg by mouth 2 (two) times daily.          . insulin glargine (LANTUS) 100 UNIT/ML injection   Subcutaneous   Inject 60 Units into the skin daily.          Marland Kitchen lisinopril  (PRINIVIL,ZESTRIL) 10 MG tablet   Oral   Take 10 mg by mouth daily.           . metFORMIN (GLUCOPHAGE) 500 MG tablet      500 mg 2 (two) times daily with a meal.         . oxyCODONE-acetaminophen (PERCOCET/ROXICET) 5-325 MG per tablet   Oral   Take 2 tablets by mouth every 4 (four) hours as needed for pain.   15 tablet   0   . pravastatin (PRAVACHOL) 10 MG tablet   Oral   Take 10 mg by mouth daily.           . promethazine (PHENERGAN) 25 MG tablet   Oral   Take 1 tablet (25 mg total) by mouth every 6 (six) hours as needed for nausea.   12 tablet   0   . sitaGLIPtan-metformin (JANUMET) 50-1000 MG per tablet   Oral   Take 1 tablet by mouth 2 (two) times daily with a meal.           . EXPIRED: sucralfate (CARAFATE) 1 G tablet   Oral   Take 1 tablet (1 g total) by mouth 4 (four) times daily.   30 tablet   0    Temp(Src) 99 F (37.2 C) (Oral)  Resp 18  Ht 6\' 1"  (1.854 m)  Wt 300 lb (136.079 kg)  BMI 39.59 kg/m2  SpO2 97% Physical Exam  Nursing note and vitals reviewed. Constitutional: He is oriented to person, place, and time. He appears well-developed and well-nourished.  HENT:  Head: Normocephalic.  Right Ear: External ear normal.  Left Ear: External ear normal.  Nose: Nose normal.  Mouth/Throat: Oropharynx is clear and moist.  Eyes: Conjunctivae are normal. Pupils are equal, round, and reactive to light.  Neck: Normal range of motion. Neck supple.  Cardiovascular: Normal rate and normal heart sounds.   Pulmonary/Chest: Effort normal and breath sounds normal.  Abdominal: There is tenderness.  Musculoskeletal: Normal range of motion.  Neurological: He is alert and oriented to person, place, and time. He has normal reflexes.  Skin: Skin is warm.  Psychiatric: He has a normal mood and affect.   Pt's glucose decreased with IV fluids,  No evidence of dka.    Pt reports symptoms improved with medications ED Course   Procedures (including critical care  time)  Labs Reviewed - No data to display No results found. 1. Abdominal pain   2. Hyperglycemia   3. Gastroparesis due to DM     MDM  Pt given rx for phenergan and hydrocodone.   Pt advised to see Dr. Mayford Knife for 24 hour (friday ) recheck.    Lonia Skinner [place], PA-C 01/26/13 2245

## 2013-01-26 NOTE — ED Notes (Signed)
PA at bedside now  

## 2013-01-26 NOTE — ED Notes (Signed)
Pt states that he is having sharp mid-level abdominal pain.  Pt states that when he eats it gets worse with nausea.  Pt states that swallowing helps.  Pt in NAD, AAOx4.  Pt has palpable pain RUQ.

## 2013-01-26 NOTE — ED Notes (Signed)
Patient transported to X-ray 

## 2013-01-26 NOTE — ED Notes (Signed)
PA informed of CBG of 327 and glucose stabilizer was cancelled. PA will order dosage subcutaneous.

## 2013-01-26 NOTE — ED Notes (Signed)
Pt returned from U/S via stretcher. 

## 2013-01-27 NOTE — ED Provider Notes (Signed)
Medical screening examination/treatment/procedure(s) were performed by non-physician practitioner and as supervising physician I was immediately available for consultation/collaboration.   Gwyneth Sprout, MD 01/27/13 1550

## 2013-06-06 ENCOUNTER — Encounter (HOSPITAL_BASED_OUTPATIENT_CLINIC_OR_DEPARTMENT_OTHER): Payer: Self-pay | Admitting: Emergency Medicine

## 2013-06-06 ENCOUNTER — Emergency Department (HOSPITAL_BASED_OUTPATIENT_CLINIC_OR_DEPARTMENT_OTHER)
Admission: EM | Admit: 2013-06-06 | Discharge: 2013-06-06 | Disposition: A | Discharge status: Home / Self Care | Payer: BC Managed Care – PPO | Attending: Emergency Medicine | Admitting: Emergency Medicine

## 2013-06-06 DIAGNOSIS — Z79899 Other long term (current) drug therapy: Secondary | ICD-10-CM | POA: Insufficient documentation

## 2013-06-06 DIAGNOSIS — Z794 Long term (current) use of insulin: Secondary | ICD-10-CM | POA: Insufficient documentation

## 2013-06-06 DIAGNOSIS — J069 Acute upper respiratory infection, unspecified: Secondary | ICD-10-CM | POA: Insufficient documentation

## 2013-06-06 DIAGNOSIS — E119 Type 2 diabetes mellitus without complications: Secondary | ICD-10-CM | POA: Insufficient documentation

## 2013-06-06 DIAGNOSIS — R599 Enlarged lymph nodes, unspecified: Secondary | ICD-10-CM | POA: Insufficient documentation

## 2013-06-06 DIAGNOSIS — E7800 Pure hypercholesterolemia, unspecified: Secondary | ICD-10-CM | POA: Insufficient documentation

## 2013-06-06 DIAGNOSIS — IMO0001 Reserved for inherently not codable concepts without codable children: Secondary | ICD-10-CM | POA: Insufficient documentation

## 2013-06-06 DIAGNOSIS — J029 Acute pharyngitis, unspecified: Secondary | ICD-10-CM | POA: Insufficient documentation

## 2013-06-06 DIAGNOSIS — K219 Gastro-esophageal reflux disease without esophagitis: Secondary | ICD-10-CM | POA: Insufficient documentation

## 2013-06-06 NOTE — ED Notes (Signed)
Pt complained that he felt as bad as he did when he came and he does not feel well enough to be discharged.  Explained that viral illnesses should be treated symptomatically with OTC medications.  Pt unhappy with explanation.  D/W PA.  No new orders.  Advised pt to f/u with PMD regarding BP and to ask pharmacist for decongestant safe for high BP.  Pt given work note.

## 2013-06-06 NOTE — ED Notes (Signed)
Sore throat, cough, aching all over.

## 2013-06-06 NOTE — ED Provider Notes (Signed)
CSN: [id number]     Arrival date & time 06/06/13  2047 History   First MD Initiated Contact with Patient 06/06/13 2115     Chief Complaint  Patient presents with  . Sore Throat   (Consider location/radiation/quality/duration/timing/severity/associated sxs/prior Treatment) HPI Comments: Patient is a 32 year old male with a past medical history of diabetes, high cholesterol and GERD who presents to the emergency department complaining of sore throat, productive cough with mucus, body aches x2 days. States his throat feels swollen, pain worse with swallowing. He has not tried any over-the-counter medications for his symptoms. No aggravating or alleviating factors. Denies fever, shortness of breath, abdominal pain, nausea or vomiting. No sick contacts.  Patient is a 32 y.o. male presenting with pharyngitis. The history is provided by the patient.  Sore Throat Associated symptoms include arthralgias, coughing, myalgias and a sore throat.    Past Medical History  Diagnosis Date  . Diabetes mellitus   . High cholesterol   . GERD (gastroesophageal reflux disease)    Past Surgical History  Procedure Laterality Date  . Arm surgery    . Arm surgery     No family history on file. History  Substance Use Topics  . Smoking status: Never Smoker   . Smokeless tobacco: Never Used  . Alcohol Use: No     Comment: occasionally    Review of Systems  HENT: Positive for sore throat.   Respiratory: Positive for cough.   Musculoskeletal: Positive for arthralgias and myalgias.  All other systems reviewed and are negative.    Allergies  Review of patient's allergies indicates no known allergies.  Home Medications   Current Outpatient Rx  Name  Route  Sig  Dispense  Refill  . esomeprazole (NEXIUM) 20 MG capsule   Oral   Take 20 mg by mouth daily before breakfast. Patient is using this medication for acid reflux.         Marland Kitchen glipiZIDE (GLUCOTROL) 10 MG tablet   Oral   Take 10 mg by mouth 2  (two) times daily.          Marland Kitchen HYDROcodone-acetaminophen (NORCO/VICODIN) 5-325 MG per tablet   Oral   Take 2 tablets by mouth every 4 (four) hours as needed.   16 tablet   0   . insulin glargine (LANTUS) 100 UNIT/ML injection   Subcutaneous   Inject 60 Units into the skin daily.          Marland Kitchen lisinopril (PRINIVIL,ZESTRIL) 10 MG tablet   Oral   Take 10 mg by mouth daily.           . metFORMIN (GLUCOPHAGE) 500 MG tablet      500 mg 2 (two) times daily with a meal.         . oxyCODONE-acetaminophen (PERCOCET/ROXICET) 5-325 MG per tablet   Oral   Take 2 tablets by mouth every 4 (four) hours as needed for pain.   15 tablet   0   . pravastatin (PRAVACHOL) 10 MG tablet   Oral   Take 10 mg by mouth daily.           . promethazine (PHENERGAN) 25 MG tablet   Oral   Take 1 tablet (25 mg total) by mouth every 6 (six) hours as needed for nausea.   12 tablet   0   . promethazine (PHENERGAN) 25 MG tablet   Oral   Take 1 tablet (25 mg total) by mouth every 6 (six) hours as needed for nausea.  30 tablet   0   . sitaGLIPtan-metformin (JANUMET) 50-1000 MG per tablet   Oral   Take 1 tablet by mouth 2 (two) times daily with a meal.           . EXPIRED: sucralfate (CARAFATE) 1 G tablet   Oral   Take 1 tablet (1 g total) by mouth 4 (four) times daily.   30 tablet   0    BP 180/100  Pulse 87  Temp(Src) 98.6 F (37 C) (Oral)  Resp 20  Ht 6\' 2"  (1.88 m)  Wt 300 lb (136.079 kg)  BMI 38.50 kg/m2  SpO2 97% Physical Exam  Nursing note and vitals reviewed. Constitutional: He is oriented to person, place, and time. He appears well-developed and well-nourished. No distress.  HENT:  Head: Normocephalic and atraumatic.  Nose: Mucosal edema present.  Mouth/Throat: Uvula is midline and mucous membranes are normal. Posterior oropharyngeal edema and posterior oropharyngeal erythema present. No oropharyngeal exudate.  Post nasal drip. Nasal congestion.  Eyes: Conjunctivae are  normal.  Neck: Normal range of motion. Neck supple.  Cardiovascular: Normal rate, regular rhythm and normal heart sounds.   Pulmonary/Chest: Effort normal and breath sounds normal. No respiratory distress. He has no wheezes. He has no rales.  Musculoskeletal: Normal range of motion. He exhibits no edema.  Lymphadenopathy:    He has cervical adenopathy.       Right cervical: Superficial cervical adenopathy present.       Left cervical: Superficial cervical adenopathy present.  Neurological: He is alert and oriented to person, place, and time.  Skin: Skin is warm and dry. He is not diaphoretic.  Psychiatric: He has a normal mood and affect. His behavior is normal.    ED Course  Procedures (including critical care time) Labs Review Labs Reviewed  RAPID STREP SCREEN   Imaging Review No results found.  EKG Interpretation   None       MDM   1. Pharyngitis   2. URI (upper respiratory infection)    Pt presenting with sore throat, URI s/s. Well appearing, NAD, afebrile. Rapid strep negative. Symptomatic tx discussed. Regarding BP, he has been off of his BP medications and is scheduling an appt with his PCP to go back on them. Asymptomatic HTN. Stable for discharge. Return precautions given. Patient states understanding of treatment care plan and is agreeable.     Trevor Mace, PA-C 06/06/13 2220

## 2013-06-06 NOTE — ED Provider Notes (Signed)
Medical screening examination/treatment/procedure(s) were performed by non-physician practitioner and as supervising physician I was immediately available for consultation/collaboration.  EKG Interpretation   None        Lala Been R. Renea Schoonmaker, MD 06/06/13 2332 

## 2013-06-09 LAB — CULTURE, GROUP A STREP

## 2014-02-04 IMAGING — CR DG ABDOMEN 1V
2 series · 2 of 2 positions shown · non-contrast
Comparison: CT abdomen 4114.

CLINICAL DATA: Right upper quadrant pain.  Abdominal pain.  History
of diabetes.

ABDOMEN - 1 VIEW

[t abdomen supine (1 of 2)]
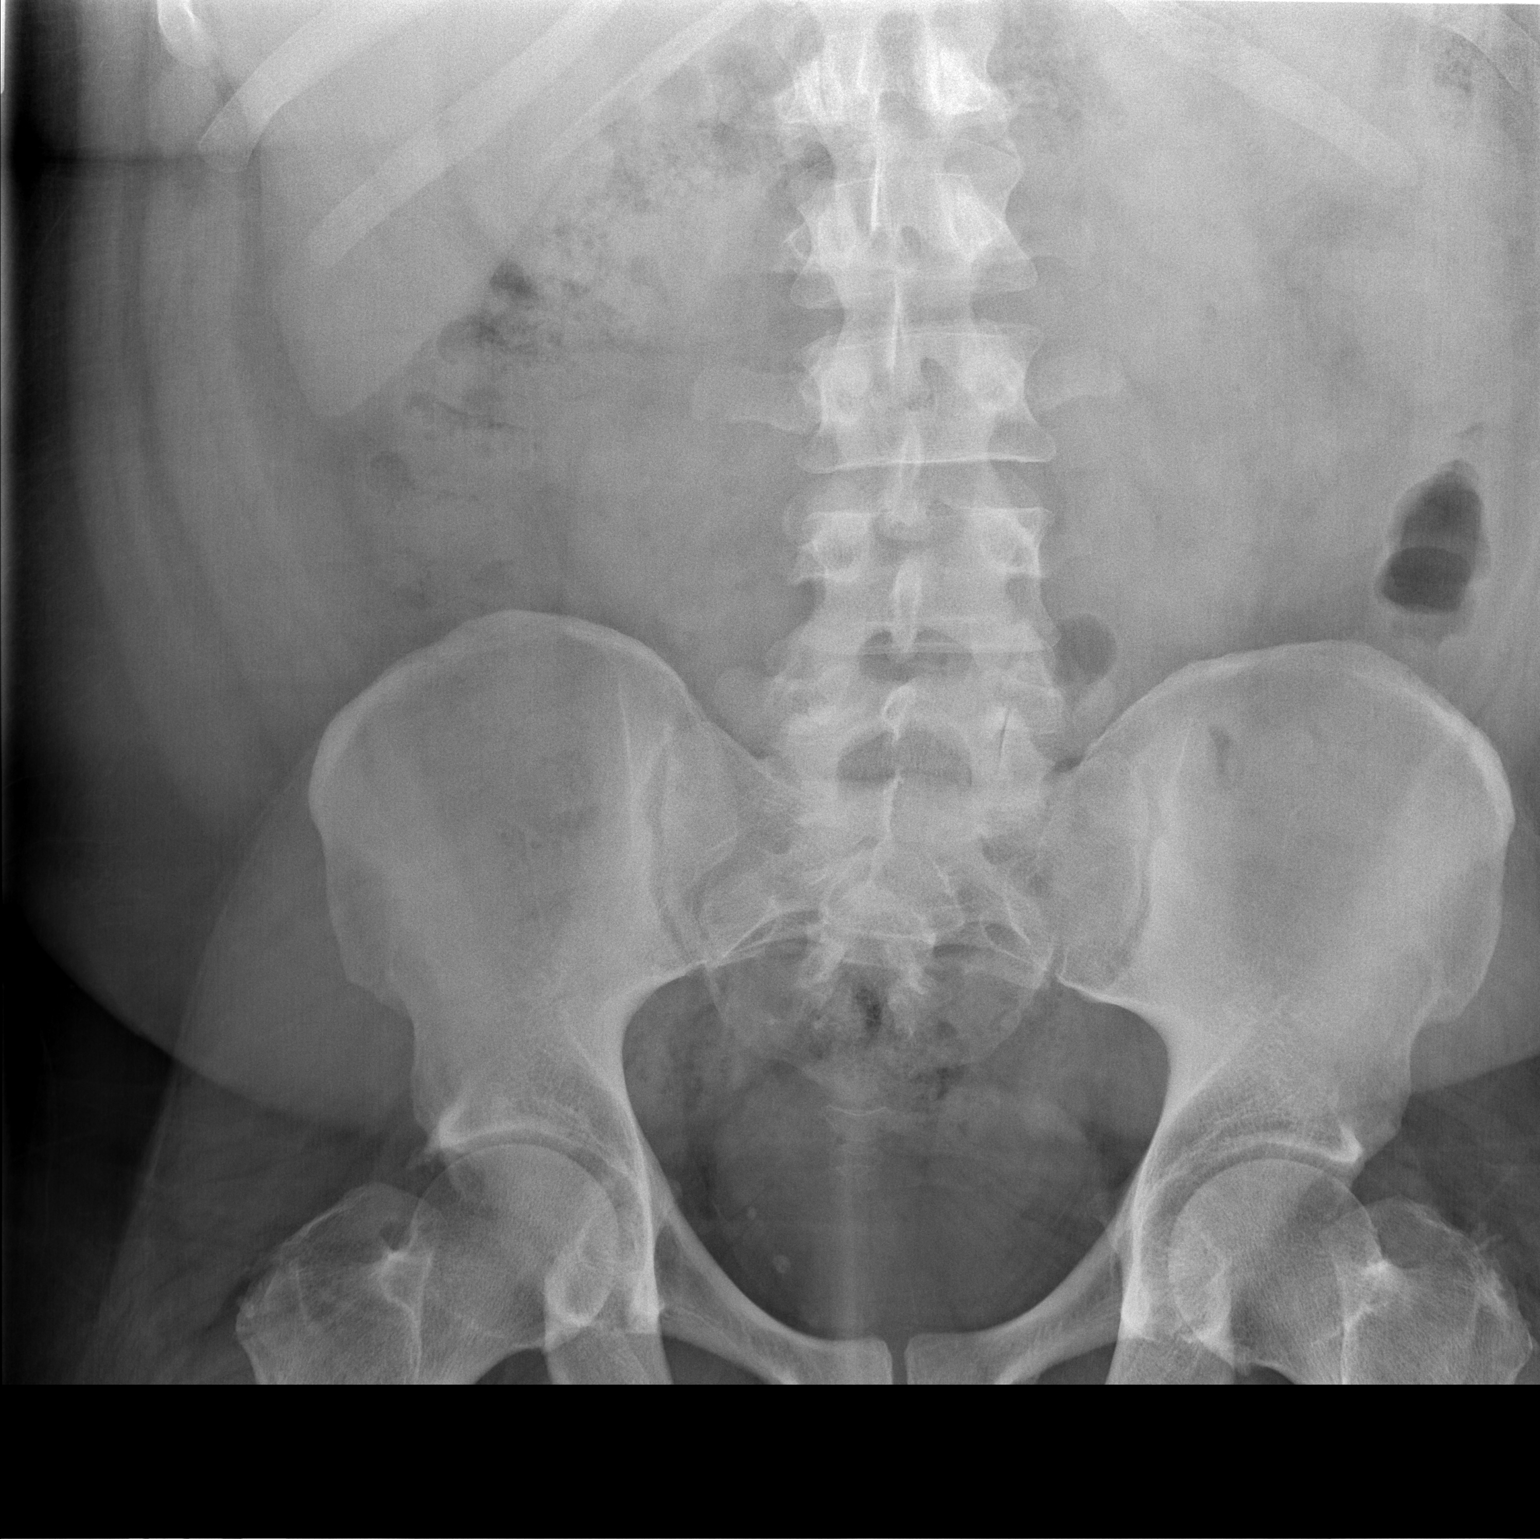

[t abdomen supine (2 of 2)]
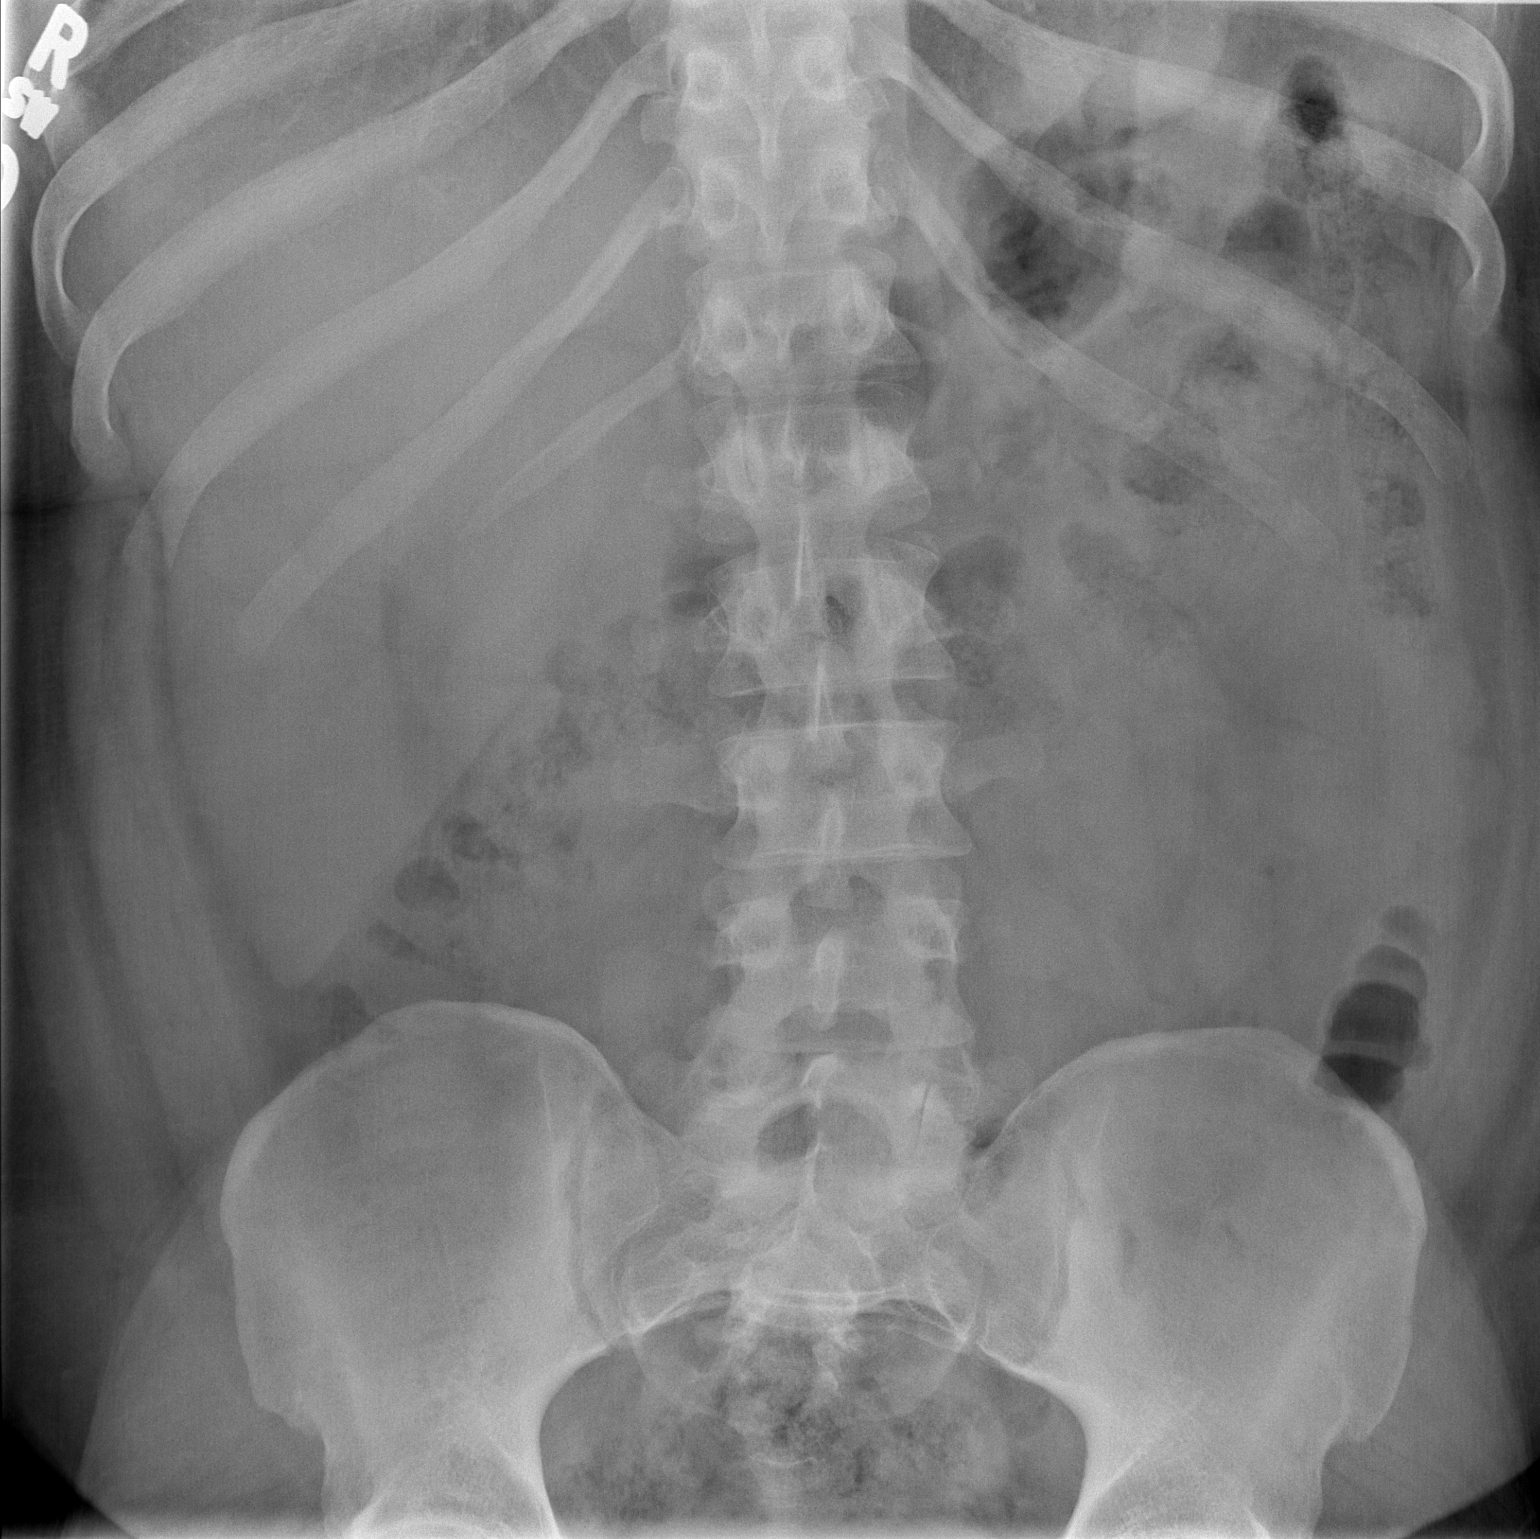

[2 of 2 positions shown; findings below may reference images not displayed]

FINDINGS: Nonobstructive bowel gas pattern is present.  No
organomegaly.  Bowel gas is present within the descending colon.
No pathologic air fluid levels or bowel dilation.  No gross plain
film evidence of free air.
IMPRESSION: Normal bowel gas pattern.

## 2015-02-20 ENCOUNTER — Encounter (HOSPITAL_BASED_OUTPATIENT_CLINIC_OR_DEPARTMENT_OTHER): Payer: Self-pay | Admitting: Emergency Medicine

## 2015-02-20 ENCOUNTER — Emergency Department (HOSPITAL_BASED_OUTPATIENT_CLINIC_OR_DEPARTMENT_OTHER)
Admission: EM | Admit: 2015-02-20 | Discharge: 2015-02-20 | Disposition: A | Discharge status: Home / Self Care | Payer: 59 | Attending: Emergency Medicine | Admitting: Emergency Medicine

## 2015-02-20 DIAGNOSIS — E780 Pure hypercholesterolemia: Secondary | ICD-10-CM | POA: Insufficient documentation

## 2015-02-20 DIAGNOSIS — R42 Dizziness and giddiness: Secondary | ICD-10-CM | POA: Insufficient documentation

## 2015-02-20 DIAGNOSIS — J029 Acute pharyngitis, unspecified: Secondary | ICD-10-CM | POA: Diagnosis not present

## 2015-02-20 DIAGNOSIS — K219 Gastro-esophageal reflux disease without esophagitis: Secondary | ICD-10-CM | POA: Insufficient documentation

## 2015-02-20 DIAGNOSIS — R739 Hyperglycemia, unspecified: Secondary | ICD-10-CM

## 2015-02-20 DIAGNOSIS — H538 Other visual disturbances: Secondary | ICD-10-CM | POA: Insufficient documentation

## 2015-02-20 DIAGNOSIS — E1165 Type 2 diabetes mellitus with hyperglycemia: Secondary | ICD-10-CM | POA: Insufficient documentation

## 2015-02-20 DIAGNOSIS — Z79899 Other long term (current) drug therapy: Secondary | ICD-10-CM | POA: Diagnosis not present

## 2015-02-20 DIAGNOSIS — Z794 Long term (current) use of insulin: Secondary | ICD-10-CM | POA: Insufficient documentation

## 2015-02-20 DIAGNOSIS — R0981 Nasal congestion: Secondary | ICD-10-CM | POA: Diagnosis present

## 2015-02-20 DIAGNOSIS — R110 Nausea: Secondary | ICD-10-CM | POA: Diagnosis not present

## 2015-02-20 DIAGNOSIS — R000 Tachycardia, unspecified: Secondary | ICD-10-CM | POA: Diagnosis not present

## 2015-02-20 LAB — URINALYSIS, ROUTINE W REFLEX MICROSCOPIC
BILIRUBIN URINE: NEGATIVE
Glucose, UA: >1000 mg/dL — AB
HGB URINE DIPSTICK: NEGATIVE
Ketones, ur: 15 mg/dL — AB
Leukocytes, UA: NEGATIVE
Nitrite: NEGATIVE
PROTEIN: NEGATIVE mg/dL
Specific Gravity, Urine: 1.044 — ABNORMAL HIGH (ref 1.005–1.030)
UROBILINOGEN UA: 0.2 mg/dL (ref 0.0–1.0)
pH: 5.5 (ref 5.0–8.0)

## 2015-02-20 LAB — CBC WITH DIFFERENTIAL/PLATELET
BASOS ABS: 0 10*3/uL (ref 0.0–0.1)
BASOS PCT: 0 % (ref 0–1)
EOS ABS: 0 10*3/uL (ref 0.0–0.7)
Eosinophils Relative: 1 % (ref 0–5)
HCT: 46.7 % (ref 39.0–52.0)
HEMOGLOBIN: 16.4 g/dL (ref 13.0–17.0)
Lymphocytes Relative: 24 % (ref 12–46)
Lymphs Abs: 1.8 10*3/uL (ref 0.7–4.0)
MCH: 28.9 pg (ref 26.0–34.0)
MCHC: 35.1 g/dL (ref 30.0–36.0)
MCV: 82.2 fL (ref 78.0–100.0)
MONOS PCT: 9 % (ref 3–12)
Monocytes Absolute: 0.6 10*3/uL (ref 0.1–1.0)
NEUTROS PCT: 66 % (ref 43–77)
Neutro Abs: 4.9 10*3/uL (ref 1.7–7.7)
Platelets: 235 10*3/uL (ref 150–400)
RBC: 5.68 MIL/uL (ref 4.22–5.81)
RDW: 12.6 % (ref 11.5–15.5)
WBC: 7.4 10*3/uL (ref 4.0–10.5)

## 2015-02-20 LAB — BASIC METABOLIC PANEL
Anion gap: 15 (ref 5–15)
BUN: 9 mg/dL (ref 6–20)
CALCIUM: 9.1 mg/dL (ref 8.9–10.3)
CHLORIDE: 96 mmol/L — AB (ref 101–111)
CO2: 24 mmol/L (ref 22–32)
CREATININE: 1.07 mg/dL (ref 0.61–1.24)
GFR calc non Af Amer: >60 mL/min (ref >60)
Glucose, Bld: 487 mg/dL — ABNORMAL HIGH (ref 65–99)
Potassium: 3.8 mmol/L (ref 3.5–5.1)
SODIUM: 135 mmol/L (ref 135–145)

## 2015-02-20 LAB — CBG MONITORING, ED
GLUCOSE-CAPILLARY: 429 mg/dL — AB (ref 65–99)
Glucose-Capillary: 315 mg/dL — ABNORMAL HIGH (ref 65–99)

## 2015-02-20 LAB — URINE MICROSCOPIC-ADD ON

## 2015-02-20 LAB — RAPID STREP SCREEN (MED CTR MEBANE ONLY): STREPTOCOCCUS, GROUP A SCREEN (DIRECT): NEGATIVE

## 2015-02-20 MED ORDER — INSULIN DETEMIR 100 UNIT/ML ~~LOC~~ SOLN: SUBCUTANEOUS | Status: AC

## 2015-02-20 MED ORDER — INSULIN REGULAR HUMAN 100 UNIT/ML IJ SOLN
10.0000 [IU] | Freq: Once | INTRAMUSCULAR | Status: AC
  Administered 2015-02-20: 10 [IU] via SUBCUTANEOUS
  Filled 2015-02-20: qty 1

## 2015-02-20 MED ORDER — SODIUM CHLORIDE 0.9 % IV BOLUS (SEPSIS)
1000.0000 mL | Freq: Once | INTRAVENOUS | Status: AC
  Administered 2015-02-20: 1000 mL via INTRAVENOUS

## 2015-02-20 MED ORDER — INSULIN REGULAR HUMAN 100 UNIT/ML IJ SOLN: 10.0000 [IU] | Freq: Once | INTRAMUSCULAR | Status: DC

## 2015-02-20 MED ORDER — METFORMIN HCL 500 MG PO TABS: 500.0000 mg | ORAL_TABLET | Freq: Two times a day (BID) | ORAL | Status: AC

## 2015-02-20 MED ORDER — GLIPIZIDE 10 MG PO TABS: 10.0000 mg | ORAL_TABLET | Freq: Two times a day (BID) | ORAL | Status: AC

## 2015-02-20 NOTE — Discharge Instructions (Signed)
Please read and follow all provided instructions.  Your diagnoses today include:  1. Hyperglycemia   2. Pharyngitis     Tests performed today include:  Blood counts and electrolytes  Urine test  Strep test - negative  Vital signs. See below for your results today.   Medications prescribed:   Take any prescribed medications only as directed.  Home care instructions:  Follow any educational materials contained in this packet.  BE VERY CAREFUL not to take multiple medicines containing Tylenol (also called acetaminophen). Doing so can lead to an overdose which can damage your liver and cause liver failure and possibly death.   Follow-up instructions: Please follow-up with your primary care provider in the next 3 days for further evaluation of your symptoms.   Return instructions:   Please return to the Emergency Department if you experience worsening symptoms.   Please return if you have any other emergent concerns.  Additional Information:  Your vital signs today were: BP 146/96 mmHg   Pulse 87   Temp(Src) 98.4 F (36.9 C) (Oral)   Resp 16   Ht  (1.88 m)   Wt 290 lb (131.543 kg)   BMI 37.22 kg/m2   SpO2 98% If your blood pressure (BP) was elevated above 135/85 this visit, please have this repeated by your doctor within one month. --------------

## 2015-02-20 NOTE — ED Provider Notes (Signed)
CSN: [id number]     Arrival date & time 02/20/15  1627 History   First MD Initiated Contact with Patient 02/20/15 1636     Chief Complaint  Patient presents with  . URI     (Consider location/radiation/quality/duration/timing/severity/associated sxs/prior Treatment) HPI Comments: Patient with history of diabetes since age 34, currently on insulin and metformin presents with complaint of URI symptoms including nasal congestion, sore throat for the past 4 days. Patient has gradually been feeling worse. Today he felt lightheaded and had blurry vision as well as fatigue. Patient reports being out of all of his diabetic medications for the past 6 days. No fevers. He has had nausea but no vomiting. No diarrhea or urinary symptoms other than polyuria. Patient has never had DKA. The onset of this condition was acute. The course is constant. Aggravating factors: none. Alleviating factors: none.    Patient is a 34 y.o. male presenting with URI. The history is provided by the patient.  URI Presenting symptoms: congestion, fatigue and sore throat   Presenting symptoms: no cough, no fever and no rhinorrhea   Associated symptoms: no headaches and no myalgias     Past Medical History  Diagnosis Date  . Diabetes mellitus   . High cholesterol   . GERD (gastroesophageal reflux disease)    Past Surgical History  Procedure Laterality Date  . Arm surgery    . Arm surgery     History reviewed. No pertinent family history. Social History  Substance Use Topics  . Smoking status: Never Smoker   . Smokeless tobacco: Never Used  . Alcohol Use: No     Comment: occasionally    Review of Systems  Constitutional: Positive for fatigue. Negative for fever and chills.  HENT: Positive for congestion, sinus pressure and sore throat. Negative for rhinorrhea.   Eyes: Positive for visual disturbance. Negative for redness.  Respiratory: Negative for cough.   Cardiovascular: Negative for chest pain.   Gastrointestinal: Positive for nausea. Negative for vomiting, abdominal pain and diarrhea.  Genitourinary: Negative for dysuria.  Musculoskeletal: Negative for myalgias.  Skin: Negative for rash.  Neurological: Positive for light-headedness. Negative for headaches.    Allergies  Review of patient's allergies indicates no known allergies.  Home Medications   Prior to Admission medications   Medication Sig Start Date End Date Taking? Authorizing Provider  insulin detemir (LEVEMIR) 100 UNIT/ML injection Inject into the skin at bedtime.   Yes Historical Provider, MD  esomeprazole (NEXIUM) 20 MG capsule Take 20 mg by mouth daily before breakfast. Patient is using this medication for acid reflux.    Historical Provider, MD  glipiZIDE (GLUCOTROL) 10 MG tablet Take 10 mg by mouth 2 (two) times daily.     Historical Provider, MD  HYDROcodone-acetaminophen (NORCO/VICODIN) 5-325 MG per tablet Take 2 tablets by mouth every 4 (four) hours as needed. 01/26/13   Elson Areas, PA-C  insulin glargine (LANTUS) 100 UNIT/ML injection Inject 60 Units into the skin daily.     Historical Provider, MD  lisinopril (PRINIVIL,ZESTRIL) 10 MG tablet Take 10 mg by mouth daily.      Historical Provider, MD  metFORMIN (GLUCOPHAGE) 500 MG tablet 500 mg 2 (two) times daily with a meal.    Historical Provider, MD  oxyCODONE-acetaminophen (PERCOCET/ROXICET) 5-325 MG per tablet Take 2 tablets by mouth every 4 (four) hours as needed for pain. 04/09/12   Kaitlyn Szekalski, PA-C  pravastatin (PRAVACHOL) 10 MG tablet Take 10 mg by mouth daily.  Historical Provider, MD  promethazine (PHENERGAN) 25 MG tablet Take 1 tablet (25 mg total) by mouth every 6 (six) hours as needed for nausea. 04/09/12   Kaitlyn Szekalski, PA-C  promethazine (PHENERGAN) 25 MG tablet Take 1 tablet (25 mg total) by mouth every 6 (six) hours as needed for nausea. 01/26/13   Elson Areas, PA-C  sitaGLIPtan-metformin (JANUMET) 50-1000 MG per tablet Take 1  tablet by mouth 2 (two) times daily with a meal.      Historical Provider, MD  sucralfate (CARAFATE) 1 G tablet Take 1 tablet (1 g total) by mouth 4 (four) times daily. 12/25/11 12/24/12  Lorre Nick, MD   BP 160/99 mmHg  Pulse 104  Temp(Src) 99.1 F (37.3 C) (Oral)  Resp 18  Ht  (1.88 m)  Wt 290 lb (131.543 kg)  BMI 37.22 kg/m2  SpO2 97%   Physical Exam  Constitutional: He appears well-developed and well-nourished.  HENT:  Head: Normocephalic and atraumatic.  Right Ear: Tympanic membrane, external ear and ear canal normal.  Left Ear: Tympanic membrane, external ear and ear canal normal.  Nose: Mucosal edema present. No rhinorrhea.  Mouth/Throat: Uvula is midline. Oropharyngeal exudate, posterior oropharyngeal edema and posterior oropharyngeal erythema present.  Eyes: Conjunctivae are normal. Right eye exhibits no discharge. Left eye exhibits no discharge.  Neck: Normal range of motion. Neck supple.  Cardiovascular: Regular rhythm and normal heart sounds.  Tachycardia present.   No murmur heard. Pulmonary/Chest: Effort normal and breath sounds normal. No respiratory distress. He has no wheezes. He has no rales.  Abdominal: Soft. There is no tenderness. There is no rebound and no guarding.  Neurological: He is alert.  Skin: Skin is warm and dry.  Psychiatric: He has a normal mood and affect.  Nursing note and vitals reviewed.   ED Course  Procedures (including critical care time) Labs Review Labs Reviewed  BASIC METABOLIC PANEL - Abnormal; Notable for the following:    Chloride 96 (*)    Glucose, Bld 487 (*)    All other components within normal limits  URINALYSIS, ROUTINE W REFLEX MICROSCOPIC (NOT AT [facility]) - Abnormal; Notable for the following:    Specific Gravity, Urine 1.044 (*)    Glucose, UA >1000 (*)    Ketones, ur 15 (*)    All other components within normal limits  CBG MONITORING, ED - Abnormal; Notable for the following:    Glucose-Capillary 429 (*)    All  other components within normal limits  CBG MONITORING, ED - Abnormal; Notable for the following:    Glucose-Capillary 315 (*)    All other components within normal limits  RAPID STREP SCREEN (NOT AT [facility])  CULTURE, GROUP A STREP  CBC WITH DIFFERENTIAL/PLATELET  URINE MICROSCOPIC-ADD ON    Imaging Review No results found. I have personally reviewed and evaluated these images and lab results as part of my medical decision-making.   EKG Interpretation None       5:00 PM Patient seen and examined. Work-up initiated. Medications ordered.   Vital signs reviewed and are as follows: BP 160/99 mmHg  Pulse 104  Temp(Src) 99.1 F (37.3 C) (Oral)  Resp 18  Ht  (1.88 m)  Wt 290 lb (131.543 kg)  BMI 37.22 kg/m2  SpO2 97%  6:16 PM Labs reviewed. No DKA suggested. Will re-evaluate.   8:44 PM Patient hydrated in emergency department. Strep negative. No evidence of DKA. Patient is tolerating oral fluids. At this time will discharge to home. Patient  requests insulin coverage prior to discharge. 10 units of regular insulin ordered subcutaneous.  Conservative measures for pharyngitis.   Patient urged to return with worsening symptoms or other concerns. Patient verbalized understanding and agrees with plan.    MDM   Final diagnoses:  Hyperglycemia  Pharyngitis   Hyperglycemia: No elevated AG. Hydrated and SQ insulin given with improvement. Patient given prescriptions for home.  Pharyngitis: Strep test negative. No peritonsillar abscess or other deep space infection in neck suspected. Patient is well-appearing.      Renne Crigler, PA-C 02/20/15 2046  Gilda Crease, MD 02/21/15 [phone]
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
## 2015-02-20 NOTE — ED Notes (Signed)
Report given to Bobby RN. Assumed pt care at this time.  

## 2015-02-22 LAB — CULTURE, GROUP A STREP

## 2015-02-24 ENCOUNTER — Telehealth: Payer: Self-pay | Admitting: *Deleted
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
## 2017-10-25 ENCOUNTER — Emergency Department (HOSPITAL_BASED_OUTPATIENT_CLINIC_OR_DEPARTMENT_OTHER)
Admission: EM | Admit: 2017-10-25 | Discharge: 2017-10-25 | Disposition: A | Discharge status: Home / Self Care | Payer: BLUE CROSS/BLUE SHIELD | Attending: Emergency Medicine | Admitting: Emergency Medicine

## 2017-10-25 ENCOUNTER — Other Ambulatory Visit: Payer: Self-pay

## 2017-10-25 ENCOUNTER — Encounter (HOSPITAL_BASED_OUTPATIENT_CLINIC_OR_DEPARTMENT_OTHER): Payer: Self-pay | Admitting: Emergency Medicine

## 2017-10-25 DIAGNOSIS — Y939 Activity, unspecified: Secondary | ICD-10-CM | POA: Insufficient documentation

## 2017-10-25 DIAGNOSIS — Y929 Unspecified place or not applicable: Secondary | ICD-10-CM | POA: Diagnosis not present

## 2017-10-25 DIAGNOSIS — T25122A Burn of first degree of left foot, initial encounter: Secondary | ICD-10-CM

## 2017-10-25 DIAGNOSIS — Z7901 Long term (current) use of anticoagulants: Secondary | ICD-10-CM | POA: Insufficient documentation

## 2017-10-25 DIAGNOSIS — I10 Essential (primary) hypertension: Secondary | ICD-10-CM | POA: Diagnosis not present

## 2017-10-25 DIAGNOSIS — E119 Type 2 diabetes mellitus without complications: Secondary | ICD-10-CM | POA: Insufficient documentation

## 2017-10-25 DIAGNOSIS — Y999 Unspecified external cause status: Secondary | ICD-10-CM | POA: Insufficient documentation

## 2017-10-25 DIAGNOSIS — T25022A Burn of unspecified degree of left foot, initial encounter: Secondary | ICD-10-CM | POA: Diagnosis not present

## 2017-10-25 DIAGNOSIS — S99922A Unspecified injury of left foot, initial encounter: Secondary | ICD-10-CM | POA: Diagnosis present

## 2017-10-25 DIAGNOSIS — X19XXXA Contact with other heat and hot substances, initial encounter: Secondary | ICD-10-CM | POA: Insufficient documentation
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
## 2017-10-25 NOTE — Discharge Instructions (Addendum)
You may use the crutches to keep weight off of your foot until the burn heals.  He can start to put weight on the foot as your pain allows.  Clean the area at least once daily with warm water and soap then apply a thin layer of Vaseline or a topical antibiotic such as bacitracin or Neosporin. After this has been applied, apply a band-aid or gauze dressing to the area.   Take 650 mg of Tylenol or 600 mg of ibuprofen with food every 6 hours for pain control.  Keeping good control of your blood sugar will help to prevent any complications of wound healing.  If you develop any new or worsening symptoms including if the top of the foot gets red, hot to the touch, swollen, or if you develop fever or chills, please return to the emergency department for re-evaluation.
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
## 2021-06-04 ENCOUNTER — Encounter: Payer: Self-pay | Admitting: Emergency Medicine

## 2021-06-04 ENCOUNTER — Emergency Department (INDEPENDENT_AMBULATORY_CARE_PROVIDER_SITE_OTHER): Payer: 59

## 2021-06-04 ENCOUNTER — Emergency Department (INDEPENDENT_AMBULATORY_CARE_PROVIDER_SITE_OTHER)
Admission: EM | Admit: 2021-06-04 | Discharge: 2021-06-04 | Disposition: A | Discharge status: Home / Self Care | Payer: 59 | Source: Home / Self Care | Attending: Family Medicine | Admitting: Family Medicine

## 2021-06-04 ENCOUNTER — Other Ambulatory Visit: Payer: Self-pay

## 2021-06-04 DIAGNOSIS — R0902 Hypoxemia: Secondary | ICD-10-CM

## 2021-06-04 DIAGNOSIS — R0602 Shortness of breath: Secondary | ICD-10-CM | POA: Diagnosis not present

## 2021-06-04 DIAGNOSIS — J069 Acute upper respiratory infection, unspecified: Secondary | ICD-10-CM

## 2021-06-04 DIAGNOSIS — R062 Wheezing: Secondary | ICD-10-CM

## 2021-06-04 LAB — POCT INFLUENZA A/B
Influenza A, POC: NEGATIVE
Influenza B, POC: NEGATIVE

## 2021-06-04 LAB — POC SARS CORONAVIRUS 2 AG -  ED: SARS Coronavirus 2 Ag: NEGATIVE

## 2021-06-04 MED ORDER — ALBUTEROL SULFATE HFA 108 (90 BASE) MCG/ACT IN AERS
2.0000 | INHALATION_SPRAY | Freq: Once | RESPIRATORY_TRACT | Status: AC
  Administered 2021-06-04: 2 via RESPIRATORY_TRACT

## 2021-06-04 MED ORDER — KETOROLAC TROMETHAMINE 60 MG/2ML IM SOLN: 60.0000 mg | Freq: Once | INTRAMUSCULAR | Status: DC

## 2021-06-04 MED ORDER — BUDESONIDE 0.5 MG/2ML IN SUSP: 0.5000 mg | Freq: Two times a day (BID) | RESPIRATORY_TRACT | 0 refills | Status: AC

## 2021-06-04 MED ORDER — ALBUTEROL SULFATE (2.5 MG/3ML) 0.083% IN NEBU: 2.5000 mg | INHALATION_SOLUTION | Freq: Four times a day (QID) | RESPIRATORY_TRACT | 0 refills | Status: AC | PRN

## 2021-06-04 MED ORDER — METHYLPREDNISOLONE SODIUM SUCC 125 MG IJ SOLR
80.0000 mg | Freq: Once | INTRAMUSCULAR | Status: AC
  Administered 2021-06-04: 80 mg via INTRAMUSCULAR

## 2021-06-04 MED ORDER — PROMETHAZINE-DM 6.25-15 MG/5ML PO SYRP: 5.0000 mL | ORAL_SOLUTION | Freq: Four times a day (QID) | ORAL | 0 refills | Status: AC | PRN

## 2021-06-04 MED ORDER — AEROCHAMBER PLUS FLO-VU MEDIUM MISC
1.0000 | Freq: Once | Status: AC
  Administered 2021-06-04: 1

## 2021-06-04 NOTE — Discharge Instructions (Addendum)
This is a viral infection Antibiotics will not make him better I think the nebulizer is a good idea.  He needs two medicines 1 is Pulmicort 2 times a day.  This is a steroid.  It will help his breathing and not increase his sugar as much as oral steroids The other medicine is albuterol.  You may use this every 4 hours.  This is for shortness of breath I have also prescribed a medicine for cough.  It may be used every 4 hours as needed You may give ibuprofen 600 mg 3 times a day as needed for pain. Call your primary care doctor tomorrow.  He needs to be seen within the next couple of days. If worse at any time instead of better, you must go directly to the emergency room.

## 2021-06-04 NOTE — ED Triage Notes (Signed)
Patient presents to Urgent Care with complaints of cough, sore throat, fatigued, winded since 3 days ago. Denies fever or chills. Tried Mucinex and Nyquil. No sick contacts

## 2021-06-04 NOTE — ED Provider Notes (Signed)
[facility]    CSN: [id number] Arrival date & time: 06/04/21  1745      History   Chief Complaint Chief Complaint  Patient presents with   Cough   Sore Throat    HPI Reginald Davidson is a 40 y.o. male.   HPI  Poorly controlled diabetic.  He has been sick for 2 days.  He has a sore throat, cough, fatigue, difficulty breathing.  He states that he has so much mucus in his chest when he lays down he feels like he cannot breathe.  He states that he feels very weak.  He has not had history of bronchitis or pneumonia.  No history of asthma.  No history of cigarette smoking.  He does have poorly controlled diabetes, eye disease, neuropathy, amputations.  No known exposure to illness.  Past Medical History:  Diagnosis Date   Diabetes mellitus    GERD (gastroesophageal reflux disease)    High cholesterol     There are no problems to display for this patient.   Past Surgical History:  Procedure Laterality Date   arm surgery     arm surgery         Home Medications    Prior to Admission medications   Medication Sig Start Date End Date Taking? Authorizing Provider  albuterol (PROVENTIL) (2.5 MG/3ML) 0.083% nebulizer solution Take 3 mLs (2.5 mg total) by nebulization every 6 (six) hours as needed for wheezing or shortness of breath. 06/04/21  Yes Eustace Moore, MD  budesonide (PULMICORT) 0.5 MG/2ML nebulizer solution Take 2 mLs (0.5 mg total) by nebulization 2 (two) times daily. 06/04/21  Yes Eustace Moore, MD  insulin glargine (LANTUS SOLOSTAR) 100 UNIT/ML Solostar Pen Inject into the skin. 02/21/20  Yes [provider]  pregabalin (LYRICA) 200 MG capsule Take 200 mg by mouth 3 (three) times daily. 06/04/21  Yes [provider]  promethazine-dextromethorphan (PROMETHAZINE-DM) 6.25-15 MG/5ML syrup Take 5 mLs by mouth 4 (four) times daily as needed for cough. 06/04/21  Yes Eustace Moore, MD  SYNJARDY 12.10-998 MG TABS Take 1 tablet by mouth 2  (two) times daily. 05/29/21  Yes [provider]  esomeprazole (NEXIUM) 20 MG capsule Take 20 mg by mouth daily before breakfast. Patient is using this medication for acid reflux.    [provider]  glipiZIDE (GLUCOTROL) 10 MG tablet Take 1 tablet (10 mg total) by mouth 2 (two) times daily. 02/20/15   Renne Crigler, PA-C  Insulin Degludec 100 UNIT/ML SOLN Inject into the skin.    [provider]  insulin detemir (LEVEMIR) 100 UNIT/ML injection Inject into skin at bedtime 02/20/15   Renne Crigler, PA-C  lisinopril (PRINIVIL,ZESTRIL) 10 MG tablet Take 10 mg by mouth daily.      [provider]  metFORMIN (GLUCOPHAGE) 500 MG tablet Take 1 tablet (500 mg total) by mouth 2 (two) times daily with a meal. 02/20/15   Renne Crigler, PA-C    Family History History reviewed. No pertinent family history.  Social History Social History   Tobacco Use   Smoking status: Never   Smokeless tobacco: Never  Substance Use Topics   Alcohol use: No    Comment: occasionally   Drug use: No     Allergies   Patient has no known allergies.   Review of Systems Review of Systems See HPI  Physical Exam Triage Vital Signs ED Triage Vitals  Enc Vitals Group     BP 06/04/21 1824 118/82  Pulse Rate 06/04/21 1824 84     Resp 06/04/21 1824 18     Temp 06/04/21 1824 98.1 F (36.7 C)     Temp Source 06/04/21 1824 Oral     SpO2 06/04/21 1824 93 %     Weight --      Height --      Head Circumference --      Peak Flow --      Pain Score 06/04/21 1821 7     Pain Loc --      Pain Edu? --      Excl. in GC? --    No data found.  Updated Vital Signs BP 118/82 (BP Location: Left Arm)   Pulse 84   Temp 98.1 F (36.7 C) (Oral)   Resp 16   SpO2 95%      Physical Exam Constitutional:      General: He is not in acute distress.    Appearance: He is well-developed and normal weight. He is ill-appearing.  HENT:     Head: Normocephalic and atraumatic.     Right  Ear: Tympanic membrane and ear canal normal.     Left Ear: Tympanic membrane and ear canal normal.     Nose: Congestion and rhinorrhea present.     Mouth/Throat:     Pharynx: Posterior oropharyngeal erythema present.  Eyes:     Conjunctiva/sclera: Conjunctivae normal.     Pupils: Pupils are equal, round, and reactive to light.  Cardiovascular:     Rate and Rhythm: Normal rate and regular rhythm.     Heart sounds: Normal heart sounds.  Pulmonary:     Effort: Pulmonary effort is normal. No respiratory distress.     Breath sounds: Wheezing, rhonchi and rales present.     Comments: Patient will has diffuse rhonchi and wheeze preventing auscultation of other lung sounds.  Suspect rales in bases.  No tachypnea. Abdominal:     Palpations: Abdomen is soft.  Musculoskeletal:        General: Normal range of motion.     Cervical back: Normal range of motion.     Right lower leg: No edema.     Left lower leg: No edema.  Lymphadenopathy:     Cervical: No cervical adenopathy.  Skin:    General: Skin is warm and dry.  Neurological:     Mental Status: He is alert.  Psychiatric:        Mood and Affect: Mood normal.        Behavior: Behavior normal.  After auscultation additionally that revealed patient having diffuse wheezing bilaterally and rhonchi he received albuterol 2 puffs with spacer.  Solu-Medrol 80 mg IM.  And chest x-ray.  Chest x-ray was normal. Repeat examination reveals continued wheezing although not as pronounced.  Patient voices improvement.   UC Treatments / Results  Labs (all labs ordered are listed, but only abnormal results are displayed) Labs Reviewed  POCT INFLUENZA A/B - Normal  POC SARS CORONAVIRUS 2 AG -  ED - Normal    EKG   Radiology No results found.  Procedures Procedures (including critical care time)  Medications Ordered in UC Medications  albuterol (VENTOLIN HFA) 108 (90 Base) MCG/ACT inhaler 2 puff (2 puffs Inhalation Given 06/04/21 1922)   AeroChamber Plus Flo-Vu Medium MISC 1 each (1 each Other Given 06/04/21 1922)  methylPREDNISolone sodium succinate (SOLU-MEDROL) 125 mg/2 mL injection 80 mg (80 mg Intramuscular Given 06/04/21 1922)  Flu test is negative COVID test is  negative  Initial Impression / Assessment and Plan / UC Course  I have reviewed the triage vital signs and the nursing notes.  Pertinent labs & imaging results that were available during my care of the patient were reviewed by me and considered in my medical decision making (see chart for details).     Discussed with patient and wife.  They do have a nebulizer at home.  I am going to put him on nebulized steroids instead of oral steroids because of his history of uncontrolled diabetes with complications.  I am also going to leave him on nebulized albuterol for his shortness of breath and cough.  I am prescribing Phenergan DM for the cough and recommending ibuprofen with food for the chest wall pain. Patient's lab test are reviewed.  He has a normal GFR. Patient's most recent hospitalization was reviewed.  Final Clinical Impressions(s) / UC Diagnoses   Final diagnoses:  SOB (shortness of breath)  Wheezing  Upper respiratory tract infection, unspecified type  Hypoxia     Discharge Instructions      This is a viral infection Antibiotics will not make him better I think the nebulizer is a good idea.  He needs two medicines 1 is Pulmicort 2 times a day.  This is a steroid.  It will help his breathing and not increase his sugar as much as oral steroids The other medicine is albuterol.  You may use this every 4 hours.  This is for shortness of breath I have also prescribed a medicine for cough.  It may be used every 4 hours as needed You may give ibuprofen 600 mg 3 times a day as needed for pain. Call your primary care doctor tomorrow.  He needs to be seen within the next couple of days. If worse at any time instead of better, you must go directly to the  emergency room.     ED Prescriptions     Medication Sig Dispense Auth. Provider   promethazine-dextromethorphan (PROMETHAZINE-DM) 6.25-15 MG/5ML syrup Take 5 mLs by mouth 4 (four) times daily as needed for cough. 180 mL Eustace Moore, MD   albuterol (PROVENTIL) (2.5 MG/3ML) 0.083% nebulizer solution Take 3 mLs (2.5 mg total) by nebulization every 6 (six) hours as needed for wheezing or shortness of breath. 40 mL Eustace Moore, MD   budesonide (PULMICORT) 0.5 MG/2ML nebulizer solution Take 2 mLs (0.5 mg total) by nebulization 2 (two) times daily. 20 mL Eustace Moore, MD      PDMP not reviewed this encounter.   Eustace Moore, MD 06/08/21 [phone]

## 2021-07-05 ENCOUNTER — Encounter (HOSPITAL_BASED_OUTPATIENT_CLINIC_OR_DEPARTMENT_OTHER): Payer: Self-pay | Admitting: *Deleted

## 2021-07-05 ENCOUNTER — Emergency Department (HOSPITAL_BASED_OUTPATIENT_CLINIC_OR_DEPARTMENT_OTHER)
Admission: EM | Admit: 2021-07-05 | Discharge: 2021-07-06 | Disposition: A | Discharge status: Home / Self Care | Payer: 59 | Attending: Emergency Medicine | Admitting: Emergency Medicine

## 2021-07-05 ENCOUNTER — Other Ambulatory Visit: Payer: Self-pay

## 2021-07-05 DIAGNOSIS — K3184 Gastroparesis: Secondary | ICD-10-CM | POA: Insufficient documentation

## 2021-07-05 DIAGNOSIS — R197 Diarrhea, unspecified: Secondary | ICD-10-CM | POA: Insufficient documentation

## 2021-07-05 DIAGNOSIS — Z7984 Long term (current) use of oral hypoglycemic drugs: Secondary | ICD-10-CM | POA: Diagnosis not present

## 2021-07-05 DIAGNOSIS — K219 Gastro-esophageal reflux disease without esophagitis: Secondary | ICD-10-CM | POA: Diagnosis not present

## 2021-07-05 DIAGNOSIS — E1143 Type 2 diabetes mellitus with diabetic autonomic (poly)neuropathy: Secondary | ICD-10-CM | POA: Insufficient documentation

## 2021-07-05 DIAGNOSIS — Z794 Long term (current) use of insulin: Secondary | ICD-10-CM | POA: Diagnosis not present

## 2021-07-05 DIAGNOSIS — R1011 Right upper quadrant pain: Secondary | ICD-10-CM | POA: Diagnosis present

## 2021-07-05 DIAGNOSIS — Z79899 Other long term (current) drug therapy: Secondary | ICD-10-CM | POA: Insufficient documentation

## 2021-07-05 MED ORDER — HALOPERIDOL LACTATE 5 MG/ML IJ SOLN
5.0000 mg | Freq: Once | INTRAMUSCULAR | Status: AC | PRN
  Administered 2021-07-05: 5 mg via INTRAVENOUS
  Filled 2021-07-05: qty 1

## 2021-07-05 MED ORDER — ONDANSETRON HCL 4 MG/2ML IJ SOLN
4.0000 mg | Freq: Once | INTRAMUSCULAR | Status: AC
Start: 2021-07-05 — End: 2021-07-05
  Administered 2021-07-05: 4 mg via INTRAVENOUS
  Filled 2021-07-05: qty 2

## 2021-07-05 MED ORDER — LACTATED RINGERS IV BOLUS
1000.0000 mL | Freq: Once | INTRAVENOUS | Status: AC
  Administered 2021-07-05: 1000 mL via INTRAVENOUS

## 2021-07-05 NOTE — ED Triage Notes (Signed)
Hx of gastroparesis. Abdominal pain x 18 hours.

## 2021-07-06 LAB — COMPREHENSIVE METABOLIC PANEL
ALT: 19 U/L (ref 0–44)
AST: 21 U/L (ref 15–41)
Albumin: 4.5 g/dL (ref 3.5–5.0)
Alkaline Phosphatase: 70 U/L (ref 38–126)
Anion gap: 16 — ABNORMAL HIGH (ref 5–15)
BUN: 11 mg/dL (ref 6–20)
CO2: 19 mmol/L — ABNORMAL LOW (ref 22–32)
Calcium: 9.6 mg/dL (ref 8.9–10.3)
Chloride: 102 mmol/L (ref 98–111)
Creatinine, Ser: 0.91 mg/dL (ref 0.61–1.24)
GFR, Estimated: >60 mL/min (ref >60)
Glucose, Bld: 180 mg/dL — ABNORMAL HIGH (ref 70–99)
Potassium: 3.6 mmol/L (ref 3.5–5.1)
Sodium: 137 mmol/L (ref 135–145)
Total Bilirubin: 0.6 mg/dL (ref 0.3–1.2)
Total Protein: 8.9 g/dL — ABNORMAL HIGH (ref 6.5–8.1)

## 2021-07-06 LAB — CBC WITH DIFFERENTIAL/PLATELET
Abs Immature Granulocytes: 0.04 10*3/uL (ref 0.00–0.07)
Basophils Absolute: 0 10*3/uL (ref 0.0–0.1)
Basophils Relative: 0 %
Eosinophils Absolute: 0 10*3/uL (ref 0.0–0.5)
Eosinophils Relative: 0 %
HCT: 47.5 % (ref 39.0–52.0)
Hemoglobin: 16.5 g/dL (ref 13.0–17.0)
Immature Granulocytes: 0 %
Lymphocytes Relative: 11 %
Lymphs Abs: 1.2 10*3/uL (ref 0.7–4.0)
MCH: 30.2 pg (ref 26.0–34.0)
MCHC: 34.7 g/dL (ref 30.0–36.0)
MCV: 86.8 fL (ref 80.0–100.0)
Monocytes Absolute: 0.4 10*3/uL (ref 0.1–1.0)
Monocytes Relative: 4 %
Neutro Abs: 9.8 10*3/uL — ABNORMAL HIGH (ref 1.7–7.7)
Neutrophils Relative %: 85 %
Platelets: 283 10*3/uL (ref 150–400)
RBC: 5.47 MIL/uL (ref 4.22–5.81)
RDW: 14 % (ref 11.5–15.5)
Smear Review: NORMAL
WBC: 11.5 10*3/uL — ABNORMAL HIGH (ref 4.0–10.5)
nRBC: 0 % (ref 0.0–0.2)

## 2021-07-06 LAB — URINALYSIS, ROUTINE W REFLEX MICROSCOPIC
Bilirubin Urine: NEGATIVE
Glucose, UA: >=500 mg/dL — AB
Ketones, ur: >=160 mg/dL — AB
Leukocytes,Ua: NEGATIVE
Nitrite: NEGATIVE
Protein, ur: 100 mg/dL — AB
Specific Gravity, Urine: 1.015 (ref 1.005–1.030)
pH: 7 (ref 5.0–8.0)

## 2021-07-06 LAB — URINALYSIS, MICROSCOPIC (REFLEX)

## 2021-07-06 LAB — RAPID URINE DRUG SCREEN, HOSP PERFORMED
Amphetamines: NOT DETECTED
Barbiturates: NOT DETECTED
Benzodiazepines: NOT DETECTED
Cocaine: NOT DETECTED
Opiates: NOT DETECTED
Tetrahydrocannabinol: POSITIVE — AB

## 2021-07-06 LAB — LIPASE, BLOOD: Lipase: 25 U/L (ref 11–51)

## 2021-07-06 MED ORDER — FENTANYL CITRATE PF 50 MCG/ML IJ SOSY
100.0000 ug | PREFILLED_SYRINGE | Freq: Once | INTRAMUSCULAR | Status: AC
  Administered 2021-07-06: 100 ug via INTRAVENOUS
  Filled 2021-07-06: qty 2

## 2021-07-06 MED ORDER — METOCLOPRAMIDE HCL 5 MG/ML IJ SOLN
10.0000 mg | Freq: Once | INTRAMUSCULAR | Status: AC
  Administered 2021-07-06: 10 mg via INTRAVENOUS
  Filled 2021-07-06: qty 2

## 2021-07-06 NOTE — ED Provider Notes (Signed)
[facility] Provider Note: Lowella DellJ. Lane Waylon Koffler, MD, FACEP  CSN: [id number] MRN: [id number] ARRIVAL: 07/05/21 at 2252 ROOM: [facility]   CHIEF COMPLAINT  Vomiting   HISTORY OF PRESENT ILLNESS  07/06/21 2:07 AM Reginald Davidson is a 41 y.o. male with diabetes and diabetic gastroparesis.  He is here with epigastric pain, nausea, vomiting and diarrhea that began about 21 hours ago.  Symptoms are consistent with previous episodes of gastroparesis but these are worse than typical.  He rates his pain as a 10 out of 10, worse with movement or palpation.  He was given Zofran and Haldol prior to my evaluation with transient improvement but states his symptoms are returning now.   Past Medical History:  Diagnosis Date   Diabetes mellitus    GERD (gastroesophageal reflux disease)    High cholesterol     Past Surgical History:  Procedure Laterality Date   arm surgery     arm surgery      No family history on file.  Social History   Tobacco Use   Smoking status: Never   Smokeless tobacco: Never  Substance Use Topics   Alcohol use: No    Comment: occasionally   Drug use: No    Prior to Admission medications   Medication Sig Start Date End Date Taking? Authorizing Provider  albuterol (PROVENTIL) (2.5 MG/3ML) 0.083% nebulizer solution Take 3 mLs (2.5 mg total) by nebulization every 6 (six) hours as needed for wheezing or shortness of breath. 06/04/21   Eustace MooreNelson, Yvonne Sue, MD  budesonide (PULMICORT) 0.5 MG/2ML nebulizer solution Take 2 mLs (0.5 mg total) by nebulization 2 (two) times daily. 06/04/21   Eustace MooreNelson, Yvonne Sue, MD  esomeprazole (NEXIUM) 20 MG capsule Take 20 mg by mouth daily before breakfast. Patient is using this medication for acid reflux.    [provider]  glipiZIDE (GLUCOTROL) 10 MG tablet Take 1 tablet (10 mg total) by mouth 2 (two) times daily. 02/20/15   Renne CriglerGeiple, Joshua, PA-C  Insulin Degludec 100 UNIT/ML SOLN Inject into the skin.    [provider]  insulin detemir (LEVEMIR) 100 UNIT/ML injection Inject into skin at bedtime 02/20/15   Renne CriglerGeiple, Joshua, PA-C  insulin glargine (LANTUS SOLOSTAR) 100 UNIT/ML Solostar Pen Inject into the skin. 02/21/20   [provider]  lisinopril (PRINIVIL,ZESTRIL) 10 MG tablet Take 10 mg by mouth daily.      [provider]  metFORMIN (GLUCOPHAGE) 500 MG tablet Take 1 tablet (500 mg total) by mouth 2 (two) times daily with a meal. 02/20/15   Renne CriglerGeiple, Joshua, PA-C  pregabalin (LYRICA) 200 MG capsule Take 200 mg by mouth 3 (three) times daily. 06/04/21   [provider]  promethazine-dextromethorphan (PROMETHAZINE-DM) 6.25-15 MG/5ML syrup Take 5 mLs by mouth 4 (four) times daily as needed for cough. 06/04/21   Eustace MooreNelson, Yvonne Sue, MD  SYNJARDY 12.10-998 MG TABS Take 1 tablet by mouth 2 (two) times daily. 05/29/21   [provider]    Allergies Patient has no known allergies.   REVIEW OF SYSTEMS  Negative except as noted here or in the History of Present Illness.   PHYSICAL EXAMINATION  Initial Vital Signs Blood pressure (!) 176/111, pulse (!) 102, temperature 98 F (36.7 C), temperature source Oral, resp. rate 18, height 6\' 2"  (1.88 m), weight 108.9 kg, SpO2 99 %.  Examination General: Well-developed, well-nourished male in no acute distress; appearance consistent with age of record HENT: normocephalic; atraumatic Eyes: Normal appearance Neck: supple Heart: regular  rate and rhythm Lungs: clear to auscultation bilaterally Abdomen: soft; nondistended; epigastric tenderness; bowel sounds present Extremities: No deformity; full range of motion; pulses normal Neurologic: Awake, alert; motor function intact in all extremities and symmetric; no facial droop Skin: Warm and dry Psychiatric: Moaning   RESULTS  Summary of this visit's results, reviewed and interpreted by myself:   EKG Interpretation  Date/Time:    Ventricular Rate:    PR Interval:    QRS Duration:    QT Interval:    QTC Calculation:   R Axis:     Text Interpretation:         Laboratory Studies: Results for orders placed or performed during the hospital encounter of 07/05/21 (from the past 24 hour(s))  Lipase, blood     Status: None   Collection Time: 07/05/21 11:09 PM  Result Value Ref Range   Lipase 25 11 - 51 U/L  Comprehensive metabolic panel     Status: Abnormal   Collection Time: 07/05/21 11:09 PM  Result Value Ref Range   Sodium 137 135 - 145 mmol/L   Potassium 3.6 3.5 - 5.1 mmol/L   Chloride 102 98 - 111 mmol/L   CO2 19 (L) 22 - 32 mmol/L   Glucose, Bld 180 (H) 70 - 99 mg/dL   BUN 11 6 - 20 mg/dL   Creatinine, Ser 8.34 0.61 - 1.24 mg/dL   Calcium 9.6 8.9 - 19.6 mg/dL   Total Protein 8.9 (H) 6.5 - 8.1 g/dL   Albumin 4.5 3.5 - 5.0 g/dL   AST 21 15 - 41 U/L   ALT 19 0 - 44 U/L   Alkaline Phosphatase 70 38 - 126 U/L   Total Bilirubin 0.6 0.3 - 1.2 mg/dL   GFR, Estimated >22 >29 mL/min   Anion gap 16 (H) 5 - 15  CBC with Differential/Platelet     Status: Abnormal   Collection Time: 07/05/21 11:34 PM  Result Value Ref Range   WBC 11.5 (H) 4.0 - 10.5 K/uL   RBC 5.47 4.22 - 5.81 MIL/uL   Hemoglobin 16.5 13.0 - 17.0 g/dL   HCT 79.8 92.1 - 19.4 %   MCV 86.8 80.0 - 100.0 fL   MCH 30.2 26.0 - 34.0 pg   MCHC 34.7 30.0 - 36.0 g/dL   RDW 17.4 08.1 - 44.8 %   Platelets 283 150 - 400 K/uL   nRBC 0.0 0.0 - 0.2 %   Neutrophils Relative % 85 %   Neutro Abs 9.8 (H) 1.7 - 7.7 K/uL   Lymphocytes Relative 11 %   Lymphs Abs 1.2 0.7 - 4.0 K/uL   Monocytes Relative 4 %   Monocytes Absolute 0.4 0.1 - 1.0 K/uL   Eosinophils Relative 0 %   Eosinophils Absolute 0.0 0.0 - 0.5 K/uL   Basophils Relative 0 %   Basophils Absolute 0.0 0.0 - 0.1 K/uL   WBC Morphology MORPHOLOGY UNREMARKABLE    RBC Morphology MORPHOLOGY UNREMARKABLE    Smear Review Normal platelet morphology    Immature Granulocytes 0 %   Abs Immature Granulocytes 0.04 0.00 - 0.07 K/uL  Urinalysis, Routine w reflex  microscopic Urine, Clean Catch     Status: Abnormal   Collection Time: 07/06/21  2:20 AM  Result Value Ref Range   Color, Urine YELLOW YELLOW   APPearance CLEAR CLEAR   Specific Gravity, Urine 1.015 1.005 - 1.030   pH 7.0 5.0 - 8.0   Glucose, UA >=500 (A) NEGATIVE mg/dL   Hgb urine  dipstick TRACE (A) NEGATIVE   Bilirubin Urine NEGATIVE NEGATIVE   Ketones, ur >=160 (A) NEGATIVE mg/dL   Protein, ur 277 (A) NEGATIVE mg/dL   Nitrite NEGATIVE NEGATIVE   Leukocytes,Ua NEGATIVE NEGATIVE  Rapid urine drug screen (hospital performed)     Status: Abnormal   Collection Time: 07/06/21  2:20 AM  Result Value Ref Range   Opiates NONE DETECTED NONE DETECTED   Cocaine NONE DETECTED NONE DETECTED   Benzodiazepines NONE DETECTED NONE DETECTED   Amphetamines NONE DETECTED NONE DETECTED   Tetrahydrocannabinol POSITIVE (A) NONE DETECTED   Barbiturates NONE DETECTED NONE DETECTED  Urinalysis, Microscopic (reflex)     Status: Abnormal   Collection Time: 07/06/21  2:20 AM  Result Value Ref Range   RBC / HPF 0-5 0 - 5 RBC/hpf   WBC, UA 0-5 0 - 5 WBC/hpf   Bacteria, UA RARE (A) NONE SEEN   Squamous Epithelial / LPF 0-5 0 - 5   Imaging Studies: No results found.  ED COURSE and MDM  Nursing notes, initial and subsequent vitals signs, including pulse oximetry, reviewed and interpreted by myself.  Vitals:   07/05/21 2307 07/05/21 2330 07/06/21 0018 07/06/21 0215  BP: (!) 176/111 (!) 133/119 (!) 147/79 140/88  Pulse: (!) 102 97 (!) 101 95  Resp: 18 18 18 18   Temp: 98 F (36.7 C)     TempSrc: Oral     SpO2: 99% 98% 99% 96%  Weight:      Height:       Medications  ondansetron (ZOFRAN) injection 4 mg (4 mg Intravenous Given 07/05/21 2348)  lactated ringers bolus 1,000 mL (1,000 mLs Intravenous New Bag/Given 07/05/21 2348)  haloperidol lactate (HALDOL) injection 5 mg (5 mg Intravenous Given 07/05/21 2353)  metoCLOPramide (REGLAN) injection 10 mg (10 mg Intravenous Given 07/06/21 0222)  fentaNYL  (SUBLIMAZE) injection 100 mcg (100 mcg Intravenous Given 07/06/21 0224)   2:52 AM Patient is somewhat agitated and stating he wants to go home at this time.  This may represent some akathisia from the Reglan.  The patient was offered the opportunity for admission but he would rather leave at this time.  He was advised he is free to return at any time should he worsen.  He has not vomited since being in the emergency department.  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  He was advised to cut down on his use of marijuana.   PROCEDURES  Procedures   ED DIAGNOSES     ICD-10-CM   1. Diabetic gastroparesis (HCC)  E11.43    K31.84          Jaishaun Mcnab, 09/03/21, MD 07/06/21 [phone]

## 2021-07-06 NOTE — ED Notes (Signed)
Calmer now, was moaning and writhing in pain prior to meds.

## 2022-06-13 IMAGING — DX DG CHEST 2V
2 series · 2 of 2 positions shown · non-contrast
Comparison: 12/25/2011

CLINICAL DATA: Shortness of breath

EXAM:
CHEST - 2 VIEW

[chest pa]
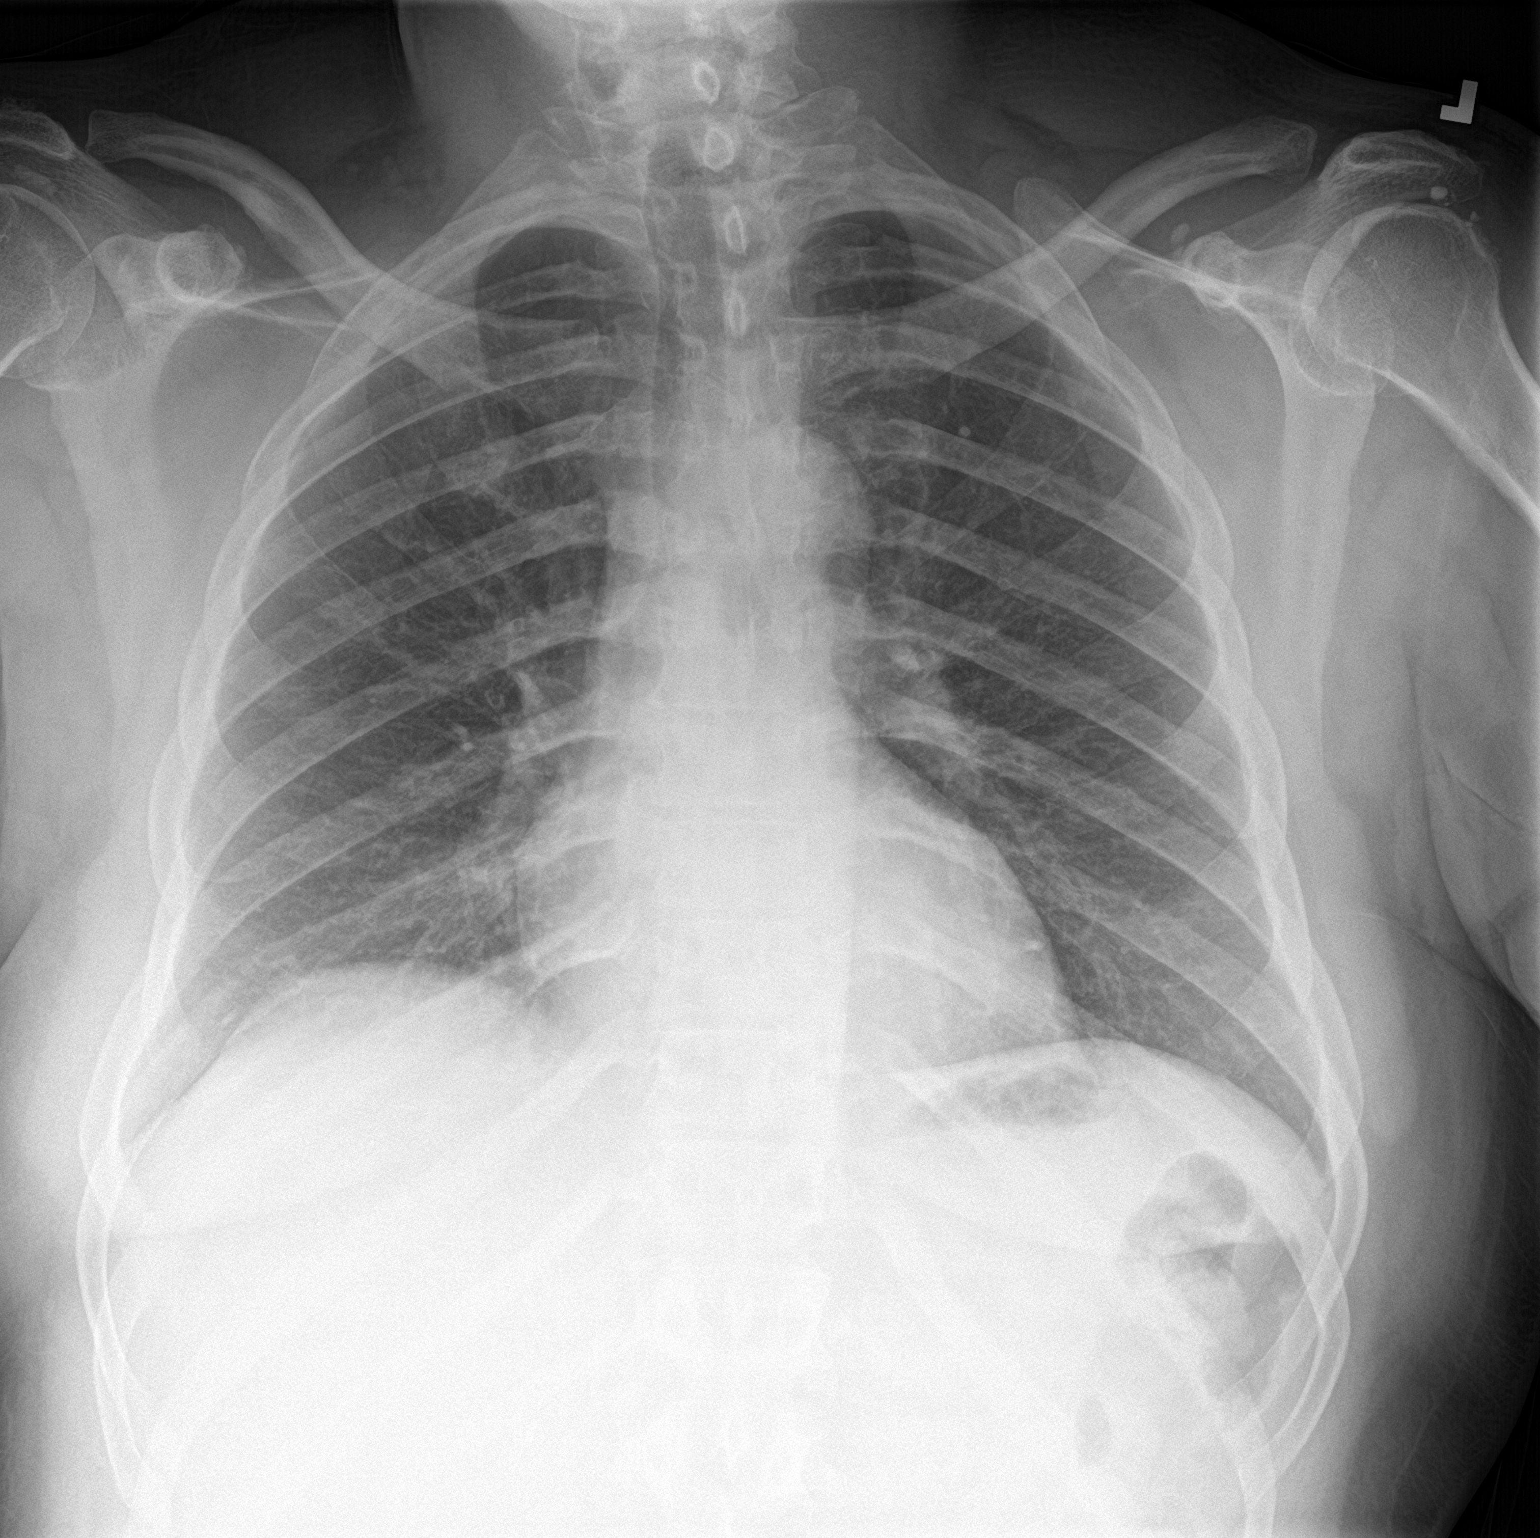

[chest lat]
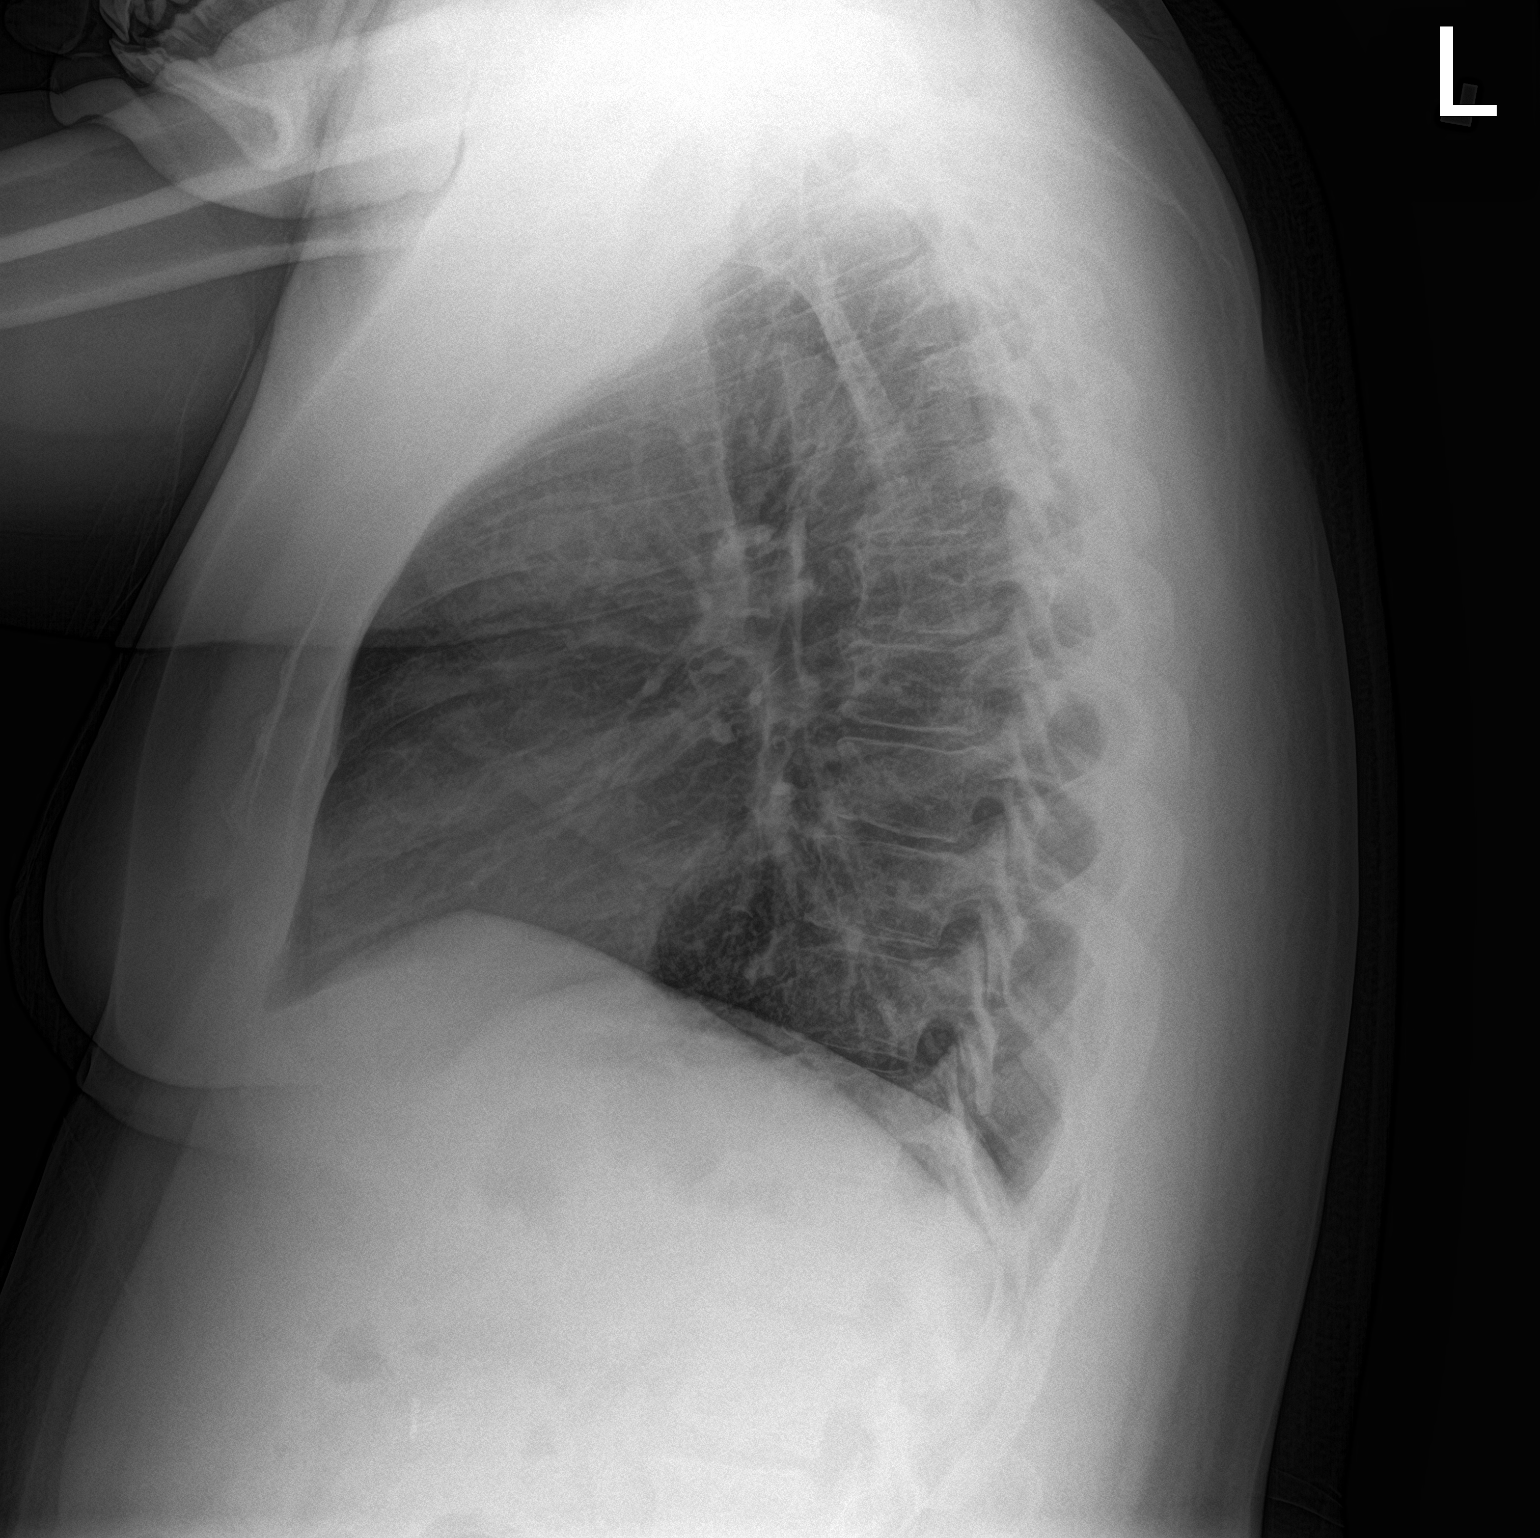

[2 of 2 positions shown; findings below may reference images not displayed]

FINDINGS: The heart size and mediastinal contours are within normal limits.
Both lungs are clear. The visualized skeletal structures are
unremarkable.
IMPRESSION: No active cardiopulmonary disease.
